# Patient Record
Sex: Female | Born: 1995 | Race: Black or African American | Hispanic: No | Marital: Single | Smoking: Current every day smoker
Health system: Southern US, Community
[De-identification: ages and names within clinical notes are randomized; demographics above are authoritative.]

## PROBLEM LIST (undated history)

## (undated) DIAGNOSIS — N730 Acute parametritis and pelvic cellulitis: Secondary | ICD-10-CM

---
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
## 2013-11-29 ENCOUNTER — Emergency Department (HOSPITAL_BASED_OUTPATIENT_CLINIC_OR_DEPARTMENT_OTHER)
Admission: EM | Admit: 2013-11-29 | Discharge: 2013-11-29 | Disposition: A | Discharge status: Home / Self Care | Payer: Medicaid Other | Attending: Emergency Medicine | Admitting: Emergency Medicine

## 2013-11-29 ENCOUNTER — Encounter (HOSPITAL_BASED_OUTPATIENT_CLINIC_OR_DEPARTMENT_OTHER): Payer: Self-pay | Admitting: Emergency Medicine

## 2013-11-29 ENCOUNTER — Emergency Department (HOSPITAL_BASED_OUTPATIENT_CLINIC_OR_DEPARTMENT_OTHER): Payer: Medicaid Other

## 2013-11-29 DIAGNOSIS — S60229A Contusion of unspecified hand, initial encounter: Secondary | ICD-10-CM | POA: Diagnosis not present

## 2013-11-29 DIAGNOSIS — Y9389 Activity, other specified: Secondary | ICD-10-CM | POA: Diagnosis not present

## 2013-11-29 DIAGNOSIS — S6990XA Unspecified injury of unspecified wrist, hand and finger(s), initial encounter: Secondary | ICD-10-CM | POA: Diagnosis present

## 2013-11-29 DIAGNOSIS — Z79899 Other long term (current) drug therapy: Secondary | ICD-10-CM | POA: Insufficient documentation

## 2013-11-29 DIAGNOSIS — Y9289 Other specified places as the place of occurrence of the external cause: Secondary | ICD-10-CM | POA: Insufficient documentation

## 2013-11-29 DIAGNOSIS — F17200 Nicotine dependence, unspecified, uncomplicated: Secondary | ICD-10-CM | POA: Diagnosis not present

## 2013-11-29 DIAGNOSIS — IMO0002 Reserved for concepts with insufficient information to code with codable children: Secondary | ICD-10-CM | POA: Diagnosis not present

## 2013-11-29 DIAGNOSIS — S60221A Contusion of right hand, initial encounter: Secondary | ICD-10-CM

## 2013-11-29 MED ORDER — IBUPROFEN 800 MG PO TABS
800.0000 mg | ORAL_TABLET | Freq: Three times a day (TID) | ORAL | Status: DC
Start: 2013-11-29 — End: 2015-09-18

## 2013-11-29 NOTE — ED Provider Notes (Signed)
Medical screening examination/treatment/procedure(s) were performed by non-physician practitioner and as supervising physician I was immediately available for consultation/collaboration.    Marcayla Budge, MD 11/29/13 2356 
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
## 2013-11-29 NOTE — Discharge Instructions (Signed)
Contusion °A contusion is a deep bruise. Contusions are the result of an injury that caused bleeding under the skin. The contusion may turn blue, purple, or yellow. Minor injuries will give you a painless contusion, but more severe contusions may stay painful and swollen for a few weeks.  °CAUSES  °A contusion is usually caused by a blow, trauma, or direct force to an area of the body. °SYMPTOMS  °· Swelling and redness of the injured area. °· Bruising of the injured area. °· Tenderness and soreness of the injured area. °· Pain. °DIAGNOSIS  °The diagnosis can be made by taking a history and physical exam. An X-ray, CT scan, or MRI may be needed to determine if there were any associated injuries, such as fractures. °TREATMENT  °Specific treatment will depend on what area of the body was injured. In general, the best treatment for a contusion is resting, icing, elevating, and applying cold compresses to the injured area. Over-the-counter medicines may also be recommended for pain control. Ask your caregiver what the best treatment is for your contusion. °HOME CARE INSTRUCTIONS  °· Put ice on the injured area. °¨ Put ice in a plastic bag. °¨ Place a towel between your skin and the bag. °¨ Leave the ice on for 15-20 minutes, 3-4 times a day, or as directed by your health care provider. °· Only take over-the-counter or prescription medicines for pain, discomfort, or fever as directed by your caregiver. Your caregiver may recommend avoiding anti-inflammatory medicines (aspirin, ibuprofen, and naproxen) for 48 hours because these medicines may increase bruising. °· Rest the injured area. °· If possible, elevate the injured area to reduce swelling. °SEEK IMMEDIATE MEDICAL CARE IF:  °· You have increased bruising or swelling. °· You have pain that is getting worse. °· Your swelling or pain is not relieved with medicines. °MAKE SURE YOU:  °· Understand these instructions. °· Will watch your condition. °· Will get help right  away if you are not doing well or get worse. °Document Released: 12/09/2004 Document Revised: 03/06/2013 Document Reviewed: 01/04/2011 °ExitCare® Patient Information ©2015 [employer]. This information is not intended to replace advice given to you by your health care provider. Make sure you discuss any questions you have with your health care provider. ° °
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
## 2014-01-14 ENCOUNTER — Encounter (HOSPITAL_BASED_OUTPATIENT_CLINIC_OR_DEPARTMENT_OTHER): Payer: Self-pay | Admitting: Emergency Medicine

## 2014-09-09 ENCOUNTER — Emergency Department (HOSPITAL_BASED_OUTPATIENT_CLINIC_OR_DEPARTMENT_OTHER)
Admission: EM | Admit: 2014-09-09 | Discharge: 2014-09-09 | Disposition: A | Discharge status: Home / Self Care | Payer: Medicaid Other | Attending: Emergency Medicine | Admitting: Emergency Medicine

## 2014-09-09 ENCOUNTER — Encounter (HOSPITAL_BASED_OUTPATIENT_CLINIC_OR_DEPARTMENT_OTHER): Payer: Self-pay | Admitting: *Deleted

## 2014-09-09 DIAGNOSIS — S4992XA Unspecified injury of left shoulder and upper arm, initial encounter: Secondary | ICD-10-CM | POA: Diagnosis present

## 2014-09-09 DIAGNOSIS — Z791 Long term (current) use of non-steroidal anti-inflammatories (NSAID): Secondary | ICD-10-CM | POA: Insufficient documentation

## 2014-09-09 DIAGNOSIS — Z720 Tobacco use: Secondary | ICD-10-CM | POA: Insufficient documentation

## 2014-09-09 DIAGNOSIS — S199XXA Unspecified injury of neck, initial encounter: Secondary | ICD-10-CM | POA: Diagnosis not present

## 2014-09-09 DIAGNOSIS — Y9389 Activity, other specified: Secondary | ICD-10-CM | POA: Insufficient documentation

## 2014-09-09 DIAGNOSIS — Y9289 Other specified places as the place of occurrence of the external cause: Secondary | ICD-10-CM | POA: Insufficient documentation

## 2014-09-09 DIAGNOSIS — M25512 Pain in left shoulder: Secondary | ICD-10-CM

## 2014-09-09 DIAGNOSIS — Y990 Civilian activity done for income or pay: Secondary | ICD-10-CM | POA: Insufficient documentation

## 2014-09-09 DIAGNOSIS — X58XXXA Exposure to other specified factors, initial encounter: Secondary | ICD-10-CM | POA: Insufficient documentation

## 2014-09-09 MED ORDER — METHOCARBAMOL 500 MG PO TABS
500.0000 mg | ORAL_TABLET | Freq: Once | ORAL | Status: AC
  Administered 2014-09-09: 500 mg via ORAL
  Filled 2014-09-09: qty 1

## 2014-09-09 MED ORDER — METHOCARBAMOL 500 MG PO TABS: 500.0000 mg | ORAL_TABLET | Freq: Two times a day (BID) | ORAL | Status: DC | PRN

## 2014-09-09 MED ORDER — NAPROXEN 250 MG PO TABS: 250.0000 mg | ORAL_TABLET | Freq: Two times a day (BID) | ORAL | Status: DC

## 2014-09-09 MED ORDER — KETOROLAC TROMETHAMINE 30 MG/ML IJ SOLN
INTRAMUSCULAR | Status: AC
  Administered 2014-09-09: 30 mg
  Filled 2014-09-09: qty 1

## 2014-09-09 MED ORDER — KETOROLAC TROMETHAMINE 60 MG/2ML IM SOLN
30.0000 mg | Freq: Once | INTRAMUSCULAR | Status: DC
  Filled 2014-09-09: qty 2

## 2014-09-09 NOTE — Discharge Instructions (Signed)
Shoulder Pain The shoulder is the joint that connects your arms to your body. The bones that form the shoulder joint include the upper arm bone (humerus), the shoulder blade (scapula), and the collarbone (clavicle). The top of the humerus is shaped like a ball and fits into a rather flat socket on the scapula (glenoid cavity). A combination of muscles and strong, fibrous tissues that connect muscles to bones (tendons) support your shoulder joint and hold the ball in the socket. Small, fluid-filled sacs (bursae) are located in different areas of the joint. They act as cushions between the bones and the overlying soft tissues and help reduce friction between the gliding tendons and the bone as you move your arm. Your shoulder joint allows a wide range of motion in your arm. This range of motion allows you to do things like scratch your back or throw a ball. However, this range of motion also makes your shoulder more prone to pain from overuse and injury. Causes of shoulder pain can originate from both injury and overuse and usually can be grouped in the following four categories:  Redness, swelling, and pain (inflammation) of the tendon (tendinitis) or the bursae (bursitis).  Instability, such as a dislocation of the joint.  Inflammation of the joint (arthritis).  Broken bone (fracture). HOME CARE INSTRUCTIONS   Apply ice to the sore area.  Put ice in a plastic bag.  Place a towel between your skin and the bag.  Leave the ice on for 15-20 minutes, 3-4 times per day for the first 2 days, or as directed by your health care provider.  Stop using cold packs if they do not help with the pain.  If you have a shoulder sling or immobilizer, wear it as long as your caregiver instructs. Only remove it to shower or bathe. Move your arm as little as possible, but keep your hand moving to prevent swelling.  Squeeze a soft ball or foam pad as much as possible to help prevent swelling.  Only take  over-the-counter or prescription medicines for pain, discomfort, or fever as directed by your caregiver. SEEK MEDICAL CARE IF:   Your shoulder pain increases, or new pain develops in your arm, hand, or fingers.  Your hand or fingers become cold and numb.  Your pain is not relieved with medicines. SEEK IMMEDIATE MEDICAL CARE IF:   Your arm, hand, or fingers are numb or tingling.  Your arm, hand, or fingers are significantly swollen or turn white or blue. MAKE SURE YOU:   Understand these instructions.  Will watch your condition.  Will get help right away if you are not doing well or get worse. Document Released: 12/09/2004 Document Revised: 07/16/2013 Document Reviewed: 02/13/2011 [facility] Patient Information 2015 [place], [place]. This information is not intended to replace advice given to you by your health care provider. Make sure you discuss any questions you have with your health care provider. Musculoskeletal Pain Musculoskeletal pain is muscle and boney aches and pains. These pains can occur in any part of the body. Your caregiver may treat you without knowing the cause of the pain. They may treat you if blood or urine tests, X-rays, and other tests were normal.  CAUSES There is often not a definite cause or reason for these pains. These pains may be caused by a type of germ (virus). The discomfort may also come from overuse. Overuse includes working out too hard when your body is not fit. Boney aches also come from weather changes. Bone  is sensitive to atmospheric pressure changes. HOME CARE INSTRUCTIONS   Ask when your test results will be ready. Make sure you get your test results.  Only take over-the-counter or prescription medicines for pain, discomfort, or fever as directed by your caregiver. If you were given medications for your condition, do not drive, operate machinery or power tools, or sign legal documents for 24 hours. Do not drink alcohol. Do not take sleeping pills  or other medications that may interfere with treatment.  Continue all activities unless the activities cause more pain. When the pain lessens, slowly resume normal activities. Gradually increase the intensity and duration of the activities or exercise.  During periods of severe pain, bed rest may be helpful. Lay or sit in any position that is comfortable.  Putting ice on the injured area.  Put ice in a bag.  Place a towel between your skin and the bag.  Leave the ice on for 15 to 20 minutes, 3 to 4 times a day.  Follow up with your caregiver for continued problems and no reason can be found for the pain. If the pain becomes worse or does not go away, it may be necessary to repeat tests or do additional testing. Your caregiver may need to look further for a possible cause. SEEK IMMEDIATE MEDICAL CARE IF:  You have pain that is getting worse and is not relieved by medications.  You develop chest pain that is associated with shortness or breath, sweating, feeling sick to your stomach (nauseous), or throw up (vomit).  Your pain becomes localized to the abdomen.  You develop any new symptoms that seem different or that concern you. MAKE SURE YOU:   Understand these instructions.  Will watch your condition.  Will get help right away if you are not doing well or get worse. Document Released: 03/01/2005 Document Revised: 05/24/2011 Document Reviewed: 11/03/2012 [facility] Patient Information 2015 [place]. This information is not intended to replace advice given to you by your health care provider. Make sure you discuss any questions you have with your health care provider.

## 2014-09-09 NOTE — ED Provider Notes (Signed)
CSN: [id number]     Arrival date & time 09/09/14  1510 History   First MD Initiated Contact with Patient 09/09/14 1520     Chief Complaint  Patient presents with  . Shoulder Pain   Amy Kennedy is a 19 y.o. female who is otherwise healthy who presents to the ED complaining of left shoulder pain since yesterday after doing lots of heavy lifting at work. The patient complains of 10 out of 10 left shoulder pain that radiates into her neck that she describes an ache. The patient has taken nothing for treatment today. 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 Yesterday she was lifting lots of very heavy boxes. She reports her pain is worsened since lifting these heavy boxes. She reports her pain is worse with movement of her arm. Patient is on Depo for birth control. The patient denies fevers, chills, numbness, tingling, weakness, chest pain, cough, shortness of breath, or previous shoulder injury.  (Consider location/radiation/quality/duration/timing/severity/associated sxs/prior Treatment) HPI  History reviewed. No pertinent past medical history. History reviewed. No pertinent past surgical history. History reviewed. No pertinent family history. History  Substance Use Topics  . Smoking status: Current Every Day Smoker -- 0.50 packs/day    Types: Cigarettes  . Smokeless tobacco: Not on file  . Alcohol Use: No   OB History    Gravida Para Term Preterm AB TAB SAB Ectopic Multiple Living   1              Review of Systems  Constitutional: Negative for fever.  Eyes: Negative for visual disturbance.  Respiratory: Negative for cough and shortness of breath.   Cardiovascular: Negative for chest pain.  Gastrointestinal: Negative for abdominal pain.  Musculoskeletal: Positive for arthralgias (left shoulder pain. ) and neck pain. Negative for back pain, joint swelling and neck stiffness.  Skin: Negative for rash.  Neurological: Negative for weakness,  light-headedness, numbness and headaches.      Allergies  Review of patient's allergies indicates no known allergies.  Home Medications   Prior to Admission medications   Medication Sig Start Date End Date Taking? Authorizing Provider  ibuprofen (ADVIL,MOTRIN) 800 MG tablet Take 1 tablet (800 mg total) by mouth 3 (three) times daily. 11/29/13   Elson AreasLeslie K Sofia, PA-C  methocarbamol (ROBAXIN) 500 MG tablet Take 1 tablet (500 mg total) by mouth 2 (two) times daily as needed for muscle spasms. 09/09/14   Everlene FarrierWilliam Hooria Gasparini, PA-C  naproxen (NAPROSYN) 250 MG tablet Take 1 tablet (250 mg total) by mouth 2 (two) times daily with a meal. 09/09/14   Everlene FarrierWilliam Brystal Kildow, PA-C   BP 129/78 mmHg  Pulse 60  Temp(Src) 98.6 F (37 C)  Resp 18  Ht 5\' 1"  (1.549 m)  Wt 165 lb (74.844 kg)  BMI 31.19 kg/m2  SpO2 98%  Breastfeeding? Unknown Physical Exam  Constitutional: She appears well-developed and well-nourished. No distress.  HENT:  Head: Normocephalic and atraumatic.  Eyes: Right eye exhibits no discharge. Left eye exhibits no discharge.  Neck: Normal range of motion. Neck supple. No JVD present. No tracheal deviation present.    Cardiovascular: Normal rate, regular rhythm, normal heart sounds and intact distal pulses.   Bilateral radial pulses are intact.  Pulmonary/Chest: Effort normal and breath sounds normal. No respiratory distress. She has no wheezes. She has no rales.  Lungs clear to auscultation bilaterally.  Musculoskeletal: Normal range of motion. She exhibits tenderness. She exhibits no edema.  There is tenderness over the  patient's left trapezius muscle that extends into her neck and into her left shoulder. Patient has no bony point tenderness. No midline back or neck tenderness. Full range of motion of her neck. No left elbow or wrist tenderness. Full left shoulder range of motion. No left shoulder, back or neck deformity, edema, erythema or ecchymosis noted.  Lymphadenopathy:    She has no  cervical adenopathy.  Neurological: She is alert. Coordination normal.  Skin: No rash noted. She is not diaphoretic.  Psychiatric: She has a normal mood and affect. Her behavior is normal.  Nursing note and vitals reviewed.   ED Course  Procedures (including critical care time) Labs Review Labs Reviewed - No data to display  Imaging Review No results found.   EKG Interpretation None      Filed Vitals:   09/09/14 1514  BP: 129/78  Pulse: 60  Temp: 98.6 F (37 C)  Resp: 18  Height:  (1.549 m)  Weight: 165 lb (74.844 kg)  SpO2: 98%     MDM   Meds given in ED:  Medications  ketorolac (TORADOL) injection 30 mg (not administered)  methocarbamol (ROBAXIN) tablet 500 mg (500 mg Oral Given 09/09/14 1550)  ketorolac (TORADOL) 30 MG/ML injection (30 mg  Given 09/09/14 1551)    New Prescriptions   METHOCARBAMOL (ROBAXIN) 500 MG TABLET    Take 1 tablet (500 mg total) by mouth 2 (two) times daily as needed for muscle spasms.   NAPROXEN (NAPROSYN) 250 MG TABLET    Take 1 tablet (250 mg total) by mouth 2 (two) times daily with a meal.    Final diagnoses:  Left shoulder pain   This  is a 19 y.o. female who is otherwise healthy who presents to the ED complaining of left shoulder pain since yesterday after doing lots of heavy lifting at work. The patient complains of 10 out of 10 left shoulder pain that radiates into her neck that she describes an ache. The patient has taken nothing for treatment today. 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 Yesterday she was lifting lots of very heavy boxes. On exam the patient is afebrile and nontoxic appearing. Patient has tenderness over her left trapezius muscle. There is no bony point tenderness noted to her left shoulder. No midline neck or back tenderness. She has full range of motion of her neck. The patient has musculoskeletal left shoulder and neck pain. Patient given Toradol and Robaxin in the  emergency department. Patient discharged with prescription for naproxen and Robaxin. I advised patient use caution while taking Robaxin as it can make her drowsy. I advised the patient to follow-up with their primary care provider this week. I advised the patient to return to the emergency department with new or worsening symptoms or new concerns. The patient verbalized understanding and agreement with plan.     Everlene Farrier, PA-C 09/09/14 1557  Zadie Rhine, MD 09/09/14 [phone]

## 2014-09-09 NOTE — ED Notes (Signed)
Pt c/o left shoulder and neck pain x 1 day

## 2015-07-16 ENCOUNTER — Emergency Department (HOSPITAL_BASED_OUTPATIENT_CLINIC_OR_DEPARTMENT_OTHER)
Admission: EM | Admit: 2015-07-16 | Discharge: 2015-07-17 | Disposition: A | Discharge status: Home / Self Care | Payer: Self-pay | Attending: Emergency Medicine | Admitting: Emergency Medicine

## 2015-07-16 ENCOUNTER — Emergency Department (HOSPITAL_BASED_OUTPATIENT_CLINIC_OR_DEPARTMENT_OTHER): Payer: Self-pay

## 2015-07-16 ENCOUNTER — Encounter (HOSPITAL_BASED_OUTPATIENT_CLINIC_OR_DEPARTMENT_OTHER): Payer: Self-pay | Admitting: *Deleted

## 2015-07-16 DIAGNOSIS — F17210 Nicotine dependence, cigarettes, uncomplicated: Secondary | ICD-10-CM | POA: Insufficient documentation

## 2015-07-16 DIAGNOSIS — S022XXA Fracture of nasal bones, initial encounter for closed fracture: Secondary | ICD-10-CM

## 2015-07-16 DIAGNOSIS — H1131 Conjunctival hemorrhage, right eye: Secondary | ICD-10-CM

## 2015-07-16 DIAGNOSIS — Z23 Encounter for immunization: Secondary | ICD-10-CM | POA: Insufficient documentation

## 2015-07-16 DIAGNOSIS — H05231 Hemorrhage of right orbit: Secondary | ICD-10-CM

## 2015-07-16 DIAGNOSIS — S4992XA Unspecified injury of left shoulder and upper arm, initial encounter: Secondary | ICD-10-CM

## 2015-07-16 DIAGNOSIS — Y999 Unspecified external cause status: Secondary | ICD-10-CM | POA: Insufficient documentation

## 2015-07-16 DIAGNOSIS — S0511XA Contusion of eyeball and orbital tissues, right eye, initial encounter: Secondary | ICD-10-CM | POA: Insufficient documentation

## 2015-07-16 DIAGNOSIS — Y929 Unspecified place or not applicable: Secondary | ICD-10-CM | POA: Insufficient documentation

## 2015-07-16 DIAGNOSIS — Y939 Activity, unspecified: Secondary | ICD-10-CM | POA: Insufficient documentation

## 2015-07-16 MED ORDER — TETANUS-DIPHTH-ACELL PERTUSSIS 5-2.5-18.5 LF-MCG/0.5 IM SUSP
0.5000 mL | Freq: Once | INTRAMUSCULAR | Status: AC
  Administered 2015-07-16: 0.5 mL via INTRAMUSCULAR
  Filled 2015-07-16: qty 0.5
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
## 2015-07-17 MED ORDER — HYDROCODONE-ACETAMINOPHEN 5-325 MG PO TABS: 1.0000 | ORAL_TABLET | Freq: Four times a day (QID) | ORAL | Status: DC | PRN

## 2015-07-30 ENCOUNTER — Encounter (HOSPITAL_BASED_OUTPATIENT_CLINIC_OR_DEPARTMENT_OTHER): Payer: Self-pay | Admitting: *Deleted

## 2015-07-30 ENCOUNTER — Emergency Department (HOSPITAL_BASED_OUTPATIENT_CLINIC_OR_DEPARTMENT_OTHER)
Admission: EM | Admit: 2015-07-30 | Discharge: 2015-07-30 | Disposition: A | Discharge status: Home / Self Care | Payer: Medicaid Other | Attending: Emergency Medicine | Admitting: Emergency Medicine

## 2015-07-30 DIAGNOSIS — N926 Irregular menstruation, unspecified: Secondary | ICD-10-CM | POA: Insufficient documentation

## 2015-07-30 DIAGNOSIS — F17210 Nicotine dependence, cigarettes, uncomplicated: Secondary | ICD-10-CM | POA: Insufficient documentation

## 2015-07-30 LAB — WET PREP, GENITAL
Clue Cells Wet Prep HPF POC: NONE SEEN
SPERM: NONE SEEN
Trich, Wet Prep: NONE SEEN

## 2015-07-30 LAB — URINALYSIS, ROUTINE W REFLEX MICROSCOPIC
BILIRUBIN URINE: NEGATIVE
Glucose, UA: NEGATIVE mg/dL
KETONES UR: NEGATIVE mg/dL
LEUKOCYTES UA: NEGATIVE
NITRITE: NEGATIVE
PROTEIN: NEGATIVE mg/dL
Specific Gravity, Urine: 1.024 (ref 1.005–1.030)
pH: 6.5 (ref 5.0–8.0)

## 2015-07-30 LAB — URINE MICROSCOPIC-ADD ON: WBC UA: NONE SEEN WBC/hpf (ref 0–5)

## 2015-07-30 LAB — PREGNANCY, URINE: Preg Test, Ur: NEGATIVE

## 2015-07-30 NOTE — ED Notes (Signed)
Pt c/o vaginal bleeding with clots x 10 hrs

## 2015-07-30 NOTE — ED Notes (Signed)
See downtime charting. 
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
## 2015-07-31 LAB — GC/CHLAMYDIA PROBE AMP (~~LOC~~) NOT AT ARMC
CHLAMYDIA, DNA PROBE: NEGATIVE
NEISSERIA GONORRHEA: NEGATIVE

## 2015-09-18 ENCOUNTER — Encounter (HOSPITAL_BASED_OUTPATIENT_CLINIC_OR_DEPARTMENT_OTHER): Payer: Self-pay | Admitting: *Deleted

## 2015-09-18 ENCOUNTER — Emergency Department (HOSPITAL_BASED_OUTPATIENT_CLINIC_OR_DEPARTMENT_OTHER): Payer: Self-pay

## 2015-09-18 ENCOUNTER — Emergency Department (HOSPITAL_BASED_OUTPATIENT_CLINIC_OR_DEPARTMENT_OTHER)
Admission: EM | Admit: 2015-09-18 | Discharge: 2015-09-18 | Disposition: A | Discharge status: Home / Self Care | Payer: Self-pay | Attending: Emergency Medicine | Admitting: Emergency Medicine

## 2015-09-18 DIAGNOSIS — J189 Pneumonia, unspecified organism: Secondary | ICD-10-CM | POA: Insufficient documentation

## 2015-09-18 DIAGNOSIS — F17210 Nicotine dependence, cigarettes, uncomplicated: Secondary | ICD-10-CM | POA: Insufficient documentation

## 2015-09-18 LAB — RAPID STREP SCREEN (MED CTR MEBANE ONLY): Streptococcus, Group A Screen (Direct): NEGATIVE

## 2015-09-18 MED ORDER — AZITHROMYCIN 250 MG PO TABS: 250.0000 mg | ORAL_TABLET | Freq: Every day | ORAL | Status: DC

## 2015-09-18 MED ORDER — IBUPROFEN 800 MG PO TABS
800.0000 mg | ORAL_TABLET | Freq: Once | ORAL | Status: AC
  Administered 2015-09-18: 800 mg via ORAL
  Filled 2015-09-18: qty 1

## 2015-09-18 MED ORDER — BENZONATATE 100 MG PO CAPS: 100.0000 mg | ORAL_CAPSULE | Freq: Three times a day (TID) | ORAL | Status: DC

## 2015-09-18 NOTE — ED Notes (Signed)
PA at bedside.

## 2015-09-18 NOTE — Discharge Instructions (Signed)
Take the prescribed medication as directed.  Continue tylenol or motrin as needed for fever. Follow-up with your primary care doctor. Return to the ED for new or worsening symptoms.

## 2015-09-18 NOTE — ED Notes (Signed)
Pt states sore throat x 2 days , daughter at home with strep

## 2015-09-18 NOTE — ED Provider Notes (Signed)
CSN: [id number]     Arrival date & time 09/18/15  1854 History  By signing my name below, I, Amy Kennedy, attest that this documentation has been prepared under the direction and in the presence of Garlon HatchetLisa M Marlise Fahr, PA-C. Electronically Signed: Placido SouLogan Kennedy, ED Scribe. 09/18/2015. 7:26 PM.   Chief Complaint  Patient presents with  . Sore Throat   The history is provided by the patient. No language interpreter was used.    HPI Comments: [name] is a 20 y.o. female who presents to the Emergency Department complaining of constant, mild, itchy, sore throat x 3 days. Pt states she has a young daughter at home who was dx with strep. She reports an associated, mild fever onset earlier today (101.9 F in triage), dry cough and chills. She denies having taken anything for pain management. Pt denies any other associated symptoms at this time.   History reviewed. No pertinent past medical history. History reviewed. No pertinent past surgical history. History reviewed. No pertinent family history. Social History  Substance Use Topics  . Smoking status: Current Every Day Smoker -- 0.50 packs/day    Types: Cigars  . Smokeless tobacco: None  . Alcohol Use: No   OB History    Gravida Para Term Preterm AB TAB SAB Ectopic Multiple Living   1              Review of Systems  Constitutional: Positive for fever and chills.  HENT: Positive for sore throat.   Respiratory: Positive for cough.   All other systems reviewed and are negative.  Allergies  Review of patient's allergies indicates no known allergies.  Home Medications   Prior to Admission medications   Not on File   BP 109/70 mmHg  Pulse 95  Temp(Src) 101.9 F (38.8 C) (Oral)  Resp 18  Ht 5\' 3"  (1.6 m)  Wt 133 lb (60.328 kg)  BMI 23.57 kg/m2  SpO2 97%  LMP 09/15/2015    Physical Exam  Constitutional: She is oriented to person, place, and time. She appears well-developed and well-nourished. No distress.  HENT:  Head:  Normocephalic and atraumatic.  Right Ear: Tympanic membrane and ear canal normal.  Left Ear: Tympanic membrane and ear canal normal.  Nose: Nose normal.  Mouth/Throat: Uvula is midline, oropharynx is clear and moist and mucous membranes are normal.  Tonsils overall normal in appearance bilaterally without exudate; uvula midline without evidence of peritonsillar abscess; handling secretions appropriately; no difficulty swallowing or speaking; normal phonation without stridor  Eyes: EOM are normal.  Neck: Normal range of motion. Neck supple.  Cardiovascular: Normal rate.   Pulmonary/Chest: Effort normal and breath sounds normal. No respiratory distress. She has no wheezes. She has no rales.  Abdominal: Soft.  Musculoskeletal: Normal range of motion.  Neurological: She is alert and oriented to person, place, and time.  Skin: Skin is warm and dry. She is not diaphoretic.  Psychiatric: She has a normal mood and affect.  Nursing note and vitals reviewed.  ED Course  Procedures  DIAGNOSTIC STUDIES: Oxygen Saturation is 97% on RA, normal by my interpretation.    COORDINATION OF CARE: 7:23 PM Discussed next steps with pt. Pt verbalized understanding and is agreeable with the plan.   Labs Review Labs Reviewed  RAPID STREP [facility]CREEN (NOT [facility])  CULTURE, GROUP A STREP [facility])   Imaging Review Dg Chest 2 View  09/18/2015  CLINICAL DATA:  Cough, fever, left-sided chest pain. Symptom onset today. EXAM: CHEST  2  VIEW COMPARISON:  None. FINDINGS: Lingular consolidation consistent with pneumonia. Possible patchy opacity in the left lower lobe as well. Right lung is clear. Heart size and mediastinal contours are normal. No pleural effusion, pneumothorax or pulmonary edema. Mild broad-based rightward curvature of the thoracic spine. IMPRESSION: Lingular and possible left lower lobe pneumonia. Electronically Signed   By: Rubye OaksMelanie  Ehinger M.D.   On: 09/18/2015 20:00   I have personally reviewed and  evaluated these lab results as part of my medical decision-making.   EKG Interpretation None      MDM   Final diagnoses:  CAP (community acquired pneumonia)   20 y.o. F here with fever, scratchy throat, and cough.  She is febrile but non-toxic in appearance.  Tonsils normal in appearance on exam, no signs of peritonsillar abscess.  Handling secretions well.  Lungs overall clear.  Rapid strep negative.  CXR is lingular +/- LLL pneumonia.  VS remain stable on RA.  Will start on azithromycin, tessalon.  Follow-up with PCP.  Discussed plan with patient, he/she acknowledged understanding and agreed with plan of care.  Return precautions given for new or worsening symptoms.  I personally performed the services described in this documentation, which was scribed in my presence. The recorded information has been reviewed and is accurate.  Garlon HatchetLisa M Merwyn Hodapp, PA-C 09/18/15 2042  Vanetta MuldersScott Zackowski, MD 09/20/15 [phone]

## 2015-09-21 LAB — CULTURE, GROUP A STREP (THRC)

## 2016-04-11 ENCOUNTER — Encounter (HOSPITAL_BASED_OUTPATIENT_CLINIC_OR_DEPARTMENT_OTHER): Payer: Self-pay | Admitting: Emergency Medicine

## 2016-04-11 ENCOUNTER — Emergency Department (HOSPITAL_BASED_OUTPATIENT_CLINIC_OR_DEPARTMENT_OTHER)
Admission: EM | Admit: 2016-04-11 | Discharge: 2016-04-12 | Disposition: A | Discharge status: Home / Self Care | Payer: Medicaid Other | Attending: Emergency Medicine | Admitting: Emergency Medicine

## 2016-04-11 DIAGNOSIS — F17290 Nicotine dependence, other tobacco product, uncomplicated: Secondary | ICD-10-CM | POA: Insufficient documentation

## 2016-04-11 DIAGNOSIS — A084 Viral intestinal infection, unspecified: Secondary | ICD-10-CM

## 2016-04-11 HISTORY — DX: Acute parametritis and pelvic cellulitis: N73.0

## 2016-04-11 LAB — URINALYSIS, ROUTINE W REFLEX MICROSCOPIC
GLUCOSE, UA: NEGATIVE mg/dL
Ketones, ur: >80 mg/dL — AB
Nitrite: NEGATIVE
PH: 6 (ref 5.0–8.0)
PROTEIN: 100 mg/dL — AB
Specific Gravity, Urine: 1.03 (ref 1.005–1.030)

## 2016-04-11 LAB — PREGNANCY, URINE: PREG TEST UR: NEGATIVE

## 2016-04-11 LAB — URINALYSIS, MICROSCOPIC (REFLEX)

## 2016-04-11 MED ORDER — ONDANSETRON 4 MG PO TBDP
4.0000 mg | ORAL_TABLET | Freq: Once | ORAL | Status: AC
  Administered 2016-04-11: 4 mg via ORAL
  Filled 2016-04-11: qty 1

## 2016-04-11 NOTE — ED Provider Notes (Signed)
[facility] Provider Note   CSN: [id number] Arrival date & time: 04/11/16  1914   By signing my name below, I, Amy Kennedy, attest that this documentation has been prepared under the direction and in the presence of [employer], PA-C. Electronically Signed: Clarisse Kennedy, Scribe. 04/11/16. 9:59 PM.   History   Chief Complaint Chief Complaint  Patient presents with  . Nausea   HPI  HPI Comments: Amy Kennedy is a 21 y.o. female who presents to the Emergency Department complaining of episodic vomiting 6 times today. Pt reports associated abdominal pain and decreased urination. She notes baseline vaginal discharge d/t Hx of BV. She states she has tried to eat soup and apple sauce, and she has attempted to drink ginger-ale and kaopectate, but she vomited everything right back up. 1 sick contact noted at home. Pt denies N/V, fever or chills.Marland Kitchen LNMP today.   Past Medical History:  Diagnosis Date  . PID (acute pelvic inflammatory disease)     There are no active problems to display for this patient.   History reviewed. No pertinent surgical history.  OB History    Gravida Para Term Preterm AB Living   1             SAB TAB Ectopic Multiple Live Births                   Home Medications    Prior to Admission medications   Medication Sig Start Date End Date Taking? Authorizing Provider  azithromycin (ZITHROMAX) 250 MG tablet Take 1 tablet (250 mg total) by mouth daily. Take first 2 tablets together, then 1 every day until finished. 09/18/15   Garlon Hatchet, PA-C  benzonatate (TESSALON) 100 MG capsule Take 1 capsule (100 mg total) by mouth every 8 (eight) hours. 09/18/15   Garlon Hatchet, PA-C  ondansetron (ZOFRAN) 4 MG tablet Take 1 tablet (4 mg total) by mouth every 6 (six) hours. 04/12/16   Eyvonne Mechanic, PA-C    Family History History reviewed. No pertinent family history.  Social History Social History  Substance Use Topics  . Smoking status: Current  Every Day Smoker    Packs/day: 0.50    Types: Cigars  . Smokeless tobacco: Never Used  . Alcohol use No     Allergies   Patient has no known allergies.   Review of Systems Review of Systems A complete 10 system review of systems was obtained and all systems are negative except as noted in the HPI and PMH.    Physical Exam Updated Vital Signs BP 122/74 (BP Location: Right Arm)   Pulse 84   Temp 98.7 F (37.1 C) (Oral)   Resp 14   Ht 5\' 6"  (1.676 m)   Wt 135 lb (61.2 kg)   LMP 04/11/2016   SpO2 100%   BMI 21.79 kg/m   Physical Exam  Constitutional: She is oriented to person, place, and time. She appears well-developed and well-nourished.  HENT:  Head: Normocephalic and atraumatic.  Eyes: Conjunctivae are normal. Pupils are equal, round, and reactive to light. Right eye exhibits no discharge. Left eye exhibits no discharge. No scleral icterus.  Neck: Normal range of motion. No JVD present. No tracheal deviation present.  Cardiovascular: Normal rate, regular rhythm and normal heart sounds.   Pulmonary/Chest: Effort normal and breath sounds normal. No stridor.  Abdominal:  Generalized abdominal discomfort worse in the epigastric region.  Neurological: She is alert and oriented to person, place, and time.  Coordination normal.  Psychiatric: She has a normal mood and affect. Her behavior is normal. Judgment and thought content normal.  Nursing note and vitals reviewed.  ED Treatments / Results  DIAGNOSTIC STUDIES: Oxygen Saturation is 100% on RA, normal by my interpretation.    COORDINATION OF CARE: 9:55 PM Discussed treatment plan with pt at bedside and pt agreed to plan. Will order nausea medication and attempt to give pt oral liquids, then reassess.  Labs (all labs ordered are listed, but only abnormal results are displayed) Labs Reviewed  URINALYSIS, ROUTINE W REFLEX MICROSCOPIC - Abnormal; Notable for the following:       Result Value   Color, Urine RED (*)     APPearance CLOUDY (*)    Hgb urine dipstick LARGE (*)    Bilirubin Urine SMALL (*)    Ketones, ur >80 (*)    Protein, ur 100 (*)    Leukocytes, UA SMALL (*)    All other components within normal limits  URINALYSIS, MICROSCOPIC (REFLEX) - Abnormal; Notable for the following:    Bacteria, UA RARE (*)    Squamous Epithelial / LPF 0-5 (*)    All other components within normal limits  PREGNANCY, URINE    EKG  EKG Interpretation None       Radiology No results found.  Procedures Procedures (including critical care time)  Medications Ordered in ED Medications  ondansetron (ZOFRAN-ODT) disintegrating tablet 4 mg (4 mg Oral Given 04/11/16 2203)     Initial Impression / Assessment and Plan / ED Course  I have reviewed the triage vital signs and the nursing notes.  Pertinent labs & imaging results that were available during my care of the patient were reviewed by me and considered in my medical decision making (see chart for details).  Labs: Urinalysis,  urine preg  Imaging:  Consults:  Therapeutics: Zofran  Discharge Meds:   Assessment/Plan:   Well-appearing 21 year old female with vomiting. Patient afebrile nontoxic in no acute distress. Abdominal pain and nonlocalized nonsurgical. Repeat abdominal exam shows no abdominal tenderness, patient tolerating by mouth without difficulty. Patient will be discharged home with symptomatic care instructions, Zofran, primary care follow-up symptoms persist, ED return if they worsen. She verbalized understanding and agreement to today's plan.  Final Clinical Impressions(s) / ED Diagnoses   Final diagnoses:  Viral gastroenteritis    New Prescriptions Discharge Medication List as of 04/12/2016 12:09 AM    START taking these medications   Details  ondansetron (ZOFRAN) 4 MG tablet Take 1 tablet (4 mg total) by mouth every 6 (six) hours., Starting Mon 04/12/2016, Print         Newell RubbermaidJeffrey Donta Mcinroy, PA-C 04/12/16 [id number]    Alvira MondayErin  Schlossman, MD 04/13/16 1730

## 2016-04-11 NOTE — ED Triage Notes (Signed)
Patient states that she has had N/V today. She states that every time she eats she throws up

## 2016-04-11 NOTE — ED Notes (Signed)
Given fluid challenge, tolerating without difficulty, denies c/o's

## 2016-04-11 NOTE — ED Notes (Signed)
States has had 2 periods this month and they have been irregular. No using BC

## 2016-04-12 MED ORDER — ONDANSETRON HCL 4 MG PO TABS: 4.0000 mg | ORAL_TABLET | Freq: Four times a day (QID) | ORAL | 0 refills | Status: DC

## 2016-04-12 NOTE — Discharge Instructions (Signed)
Please read attached information. If you experience any new or worsening signs or symptoms please return to the emergency room for evaluation. Please follow-up with your primary care provider or specialist as discussed. Please use medication prescribed only as directed and discontinue taking if you have any concerning signs or symptoms.   °

## 2016-04-12 NOTE — ED Notes (Signed)
Pt given d/c instructions as per chart. Rx x 1. Verbalizes understanding. No questions. 

## 2019-07-14 ENCOUNTER — Other Ambulatory Visit: Payer: Self-pay

## 2019-07-14 ENCOUNTER — Emergency Department (HOSPITAL_BASED_OUTPATIENT_CLINIC_OR_DEPARTMENT_OTHER)
Admission: EM | Admit: 2019-07-14 | Discharge: 2019-07-14 | Disposition: A | Discharge status: Home / Self Care | Payer: Medicaid Other | Attending: Emergency Medicine | Admitting: Emergency Medicine

## 2019-07-14 ENCOUNTER — Encounter (HOSPITAL_BASED_OUTPATIENT_CLINIC_OR_DEPARTMENT_OTHER): Payer: Self-pay

## 2019-07-14 DIAGNOSIS — S01512A Laceration without foreign body of oral cavity, initial encounter: Secondary | ICD-10-CM | POA: Insufficient documentation

## 2019-07-14 DIAGNOSIS — K120 Recurrent oral aphthae: Secondary | ICD-10-CM | POA: Insufficient documentation

## 2019-07-14 DIAGNOSIS — Y999 Unspecified external cause status: Secondary | ICD-10-CM | POA: Diagnosis not present

## 2019-07-14 DIAGNOSIS — Y9389 Activity, other specified: Secondary | ICD-10-CM | POA: Diagnosis not present

## 2019-07-14 DIAGNOSIS — Y9289 Other specified places as the place of occurrence of the external cause: Secondary | ICD-10-CM | POA: Diagnosis not present

## 2019-07-14 DIAGNOSIS — W228XXA Striking against or struck by other objects, initial encounter: Secondary | ICD-10-CM | POA: Insufficient documentation

## 2019-07-14 DIAGNOSIS — F17210 Nicotine dependence, cigarettes, uncomplicated: Secondary | ICD-10-CM | POA: Diagnosis not present

## 2019-07-14 DIAGNOSIS — R600 Localized edema: Secondary | ICD-10-CM | POA: Diagnosis present

## 2019-07-14 DIAGNOSIS — K122 Cellulitis and abscess of mouth: Secondary | ICD-10-CM

## 2019-07-14 MED ORDER — TRIAMCINOLONE ACETONIDE 0.1 % MT PSTE: 1.0000 "application " | PASTE | Freq: Two times a day (BID) | OROMUCOSAL | 12 refills | Status: DC

## 2019-07-14 MED ORDER — CLINDAMYCIN HCL 300 MG PO CAPS: 300.0000 mg | ORAL_CAPSULE | Freq: Three times a day (TID) | ORAL | 0 refills | Status: AC

## 2019-07-14 NOTE — Discharge Instructions (Signed)
Rinse with Listerine after every meal. Apply triamcinolone paste to canker sore inside lower lip.  Take clindamycin as prescribed and complete the full course.  Take a probiotic as well. Apply warm compresses to face for 20 minutes at a time.
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
## 2019-07-14 NOTE — ED Notes (Signed)
Pt c/o oral pain since Wednesday. Pain is upper gum on right, swelling noted
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
## 2019-08-13 ENCOUNTER — Encounter (HOSPITAL_BASED_OUTPATIENT_CLINIC_OR_DEPARTMENT_OTHER): Payer: Self-pay | Admitting: *Deleted

## 2019-08-13 ENCOUNTER — Other Ambulatory Visit: Payer: Self-pay

## 2019-08-13 ENCOUNTER — Emergency Department (HOSPITAL_BASED_OUTPATIENT_CLINIC_OR_DEPARTMENT_OTHER): Payer: Medicaid Other

## 2019-08-13 ENCOUNTER — Emergency Department (HOSPITAL_BASED_OUTPATIENT_CLINIC_OR_DEPARTMENT_OTHER)
Admission: EM | Admit: 2019-08-13 | Discharge: 2019-08-13 | Disposition: A | Discharge status: Home / Self Care | Payer: Medicaid Other | Attending: Emergency Medicine | Admitting: Emergency Medicine

## 2019-08-13 DIAGNOSIS — Z20822 Contact with and (suspected) exposure to covid-19: Secondary | ICD-10-CM | POA: Diagnosis not present

## 2019-08-13 DIAGNOSIS — R05 Cough: Secondary | ICD-10-CM | POA: Diagnosis not present

## 2019-08-13 DIAGNOSIS — R112 Nausea with vomiting, unspecified: Secondary | ICD-10-CM | POA: Diagnosis not present

## 2019-08-13 DIAGNOSIS — F17290 Nicotine dependence, other tobacco product, uncomplicated: Secondary | ICD-10-CM | POA: Diagnosis not present

## 2019-08-13 DIAGNOSIS — O99330 Smoking (tobacco) complicating pregnancy, unspecified trimester: Secondary | ICD-10-CM | POA: Diagnosis not present

## 2019-08-13 DIAGNOSIS — R058 Other specified cough: Secondary | ICD-10-CM

## 2019-08-13 DIAGNOSIS — O26891 Other specified pregnancy related conditions, first trimester: Secondary | ICD-10-CM

## 2019-08-13 DIAGNOSIS — R109 Unspecified abdominal pain: Secondary | ICD-10-CM | POA: Insufficient documentation

## 2019-08-13 DIAGNOSIS — R6883 Chills (without fever): Secondary | ICD-10-CM

## 2019-08-13 DIAGNOSIS — O26899 Other specified pregnancy related conditions, unspecified trimester: Secondary | ICD-10-CM | POA: Insufficient documentation

## 2019-08-13 DIAGNOSIS — R5383 Other fatigue: Secondary | ICD-10-CM

## 2019-08-13 DIAGNOSIS — R5381 Other malaise: Secondary | ICD-10-CM | POA: Diagnosis not present

## 2019-08-13 DIAGNOSIS — M791 Myalgia, unspecified site: Secondary | ICD-10-CM

## 2019-08-13 LAB — COMPREHENSIVE METABOLIC PANEL
ALT: 13 U/L (ref 0–44)
AST: 20 U/L (ref 15–41)
Albumin: 4.2 g/dL (ref 3.5–5.0)
Alkaline Phosphatase: 51 U/L (ref 38–126)
Anion gap: 11 (ref 5–15)
BUN: 8 mg/dL (ref 6–20)
CO2: 20 mmol/L — ABNORMAL LOW (ref 22–32)
Calcium: 8.7 mg/dL — ABNORMAL LOW (ref 8.9–10.3)
Chloride: 105 mmol/L (ref 98–111)
Creatinine, Ser: 0.68 mg/dL (ref 0.44–1.00)
GFR calc Af Amer: >60 mL/min (ref >60)
GFR calc non Af Amer: >60 mL/min (ref >60)
Glucose, Bld: 77 mg/dL (ref 70–99)
Potassium: 3.5 mmol/L (ref 3.5–5.1)
Sodium: 136 mmol/L (ref 135–145)
Total Bilirubin: 0.6 mg/dL (ref 0.3–1.2)
Total Protein: 7.2 g/dL (ref 6.5–8.1)

## 2019-08-13 LAB — URINALYSIS, ROUTINE W REFLEX MICROSCOPIC
Bilirubin Urine: NEGATIVE
Glucose, UA: NEGATIVE mg/dL
Hgb urine dipstick: NEGATIVE
Ketones, ur: 15 mg/dL — AB
Leukocytes,Ua: NEGATIVE
Nitrite: NEGATIVE
Protein, ur: NEGATIVE mg/dL
Specific Gravity, Urine: >1.03 — ABNORMAL HIGH (ref 1.005–1.030)
pH: 6 (ref 5.0–8.0)

## 2019-08-13 LAB — CBC WITH DIFFERENTIAL/PLATELET
Abs Immature Granulocytes: 0.02 10*3/uL (ref 0.00–0.07)
Basophils Absolute: 0 10*3/uL (ref 0.0–0.1)
Basophils Relative: 0 %
Eosinophils Absolute: 0.1 10*3/uL (ref 0.0–0.5)
Eosinophils Relative: 1 %
HCT: 38.4 % (ref 36.0–46.0)
Hemoglobin: 12.4 g/dL (ref 12.0–15.0)
Immature Granulocytes: 0 %
Lymphocytes Relative: 38 %
Lymphs Abs: 2.6 10*3/uL (ref 0.7–4.0)
MCH: 26.8 pg (ref 26.0–34.0)
MCHC: 32.3 g/dL (ref 30.0–36.0)
MCV: 83.1 fL (ref 80.0–100.0)
Monocytes Absolute: 0.4 10*3/uL (ref 0.1–1.0)
Monocytes Relative: 6 %
Neutro Abs: 3.8 10*3/uL (ref 1.7–7.7)
Neutrophils Relative %: 55 %
Platelets: 282 10*3/uL (ref 150–400)
RBC: 4.62 MIL/uL (ref 3.87–5.11)
RDW: 14.8 % (ref 11.5–15.5)
WBC: 6.9 10*3/uL (ref 4.0–10.5)
nRBC: 0 % (ref 0.0–0.2)

## 2019-08-13 LAB — LIPASE, BLOOD: Lipase: 26 U/L (ref 11–51)

## 2019-08-13 LAB — HCG, QUANTITATIVE, PREGNANCY: hCG, Beta Chain, Quant, S: 3376 m[IU]/mL — ABNORMAL HIGH (ref <5)

## 2019-08-13 LAB — GROUP A STREP BY PCR: Group A Strep by PCR: NOT DETECTED

## 2019-08-13 LAB — SARS CORONAVIRUS 2 BY RT PCR (HOSPITAL ORDER, PERFORMED IN ~~LOC~~ HOSPITAL LAB): SARS Coronavirus 2: NEGATIVE

## 2019-08-13 MED ORDER — SODIUM CHLORIDE 0.9 % IV BOLUS
1000.0000 mL | Freq: Once | INTRAVENOUS | Status: AC
  Administered 2019-08-13: 1000 mL via INTRAVENOUS

## 2019-08-13 MED ORDER — DOXYLAMINE-PYRIDOXINE 10-10 MG PO TBEC: 1.0000 | DELAYED_RELEASE_TABLET | Freq: Every day | ORAL | 0 refills | Status: DC

## 2019-08-13 NOTE — ED Provider Notes (Signed)
Received in sign out from Dr. Rush Landmark.Patient having congestion nv. Pregnancy test + at home.  Plan to await lab work.  Lab work is returned her Covid test is negative and she is pregnant.  She is having no abdominal pain vaginal bleeding or discharge.  We will have her follow-up with GYN in the office.  Started on diclegis for morning sickness.     Melene Plan, DO 08/13/19 1631

## 2019-08-13 NOTE — ED Provider Notes (Signed)
[facility] EMERGENCY DEPARTMENT Provider Note   CSN: [id number] Arrival date & time: 08/13/19  1307     History Chief Complaint  Patient presents with   Covid Symptoms    Amy Kennedy is a 24 y.o. female.  The history is provided by the patient and medical records. No language interpreter was used.  Cough Cough characteristics:  Productive Severity:  Moderate Onset quality:  Gradual Duration:  4 weeks Timing:  Constant Progression:  Worsening Chronicity:  New Relieved by:  Nothing Worsened by:  Nothing Ineffective treatments:  None tried Associated symptoms: chills, myalgias, rhinorrhea and sinus congestion   Associated symptoms: no chest pain, no diaphoresis, no fever, no headaches, no rash, no shortness of breath, no weight loss and no wheezing        Past Medical History:  Diagnosis Date   PID (acute pelvic inflammatory disease)     There are no problems to display for this patient.   History reviewed. No pertinent surgical history.   OB History    Gravida  1   Para      Term      Preterm      AB      Living        SAB      TAB      Ectopic      Multiple      Live Births              No family history on file.  Social History   Tobacco Use   Smoking status: Current Every Day Smoker    Packs/day: 0.50    Types: Cigars   Smokeless tobacco: Never Used  Substance Use Topics   Alcohol use: No   Drug use: No    Home Medications Prior to Admission medications   Medication Sig Start Date End Date Taking? Authorizing Provider  azithromycin (ZITHROMAX) 250 MG tablet Take 1 tablet (250 mg total) by mouth daily. Take first 2 tablets together, then 1 every day until finished. 09/18/15   Larene Pickett, PA-C  benzonatate (TESSALON) 100 MG capsule Take 1 capsule (100 mg total) by mouth every 8 (eight) hours. 09/18/15   Larene Pickett, PA-C  ondansetron (ZOFRAN) 4 MG tablet Take 1 tablet (4 mg total) by mouth every 6 (six)  hours. 04/12/16   Hedges, Dellis Filbert, PA-C  triamcinolone (KENALOG) 0.1 % paste Use as directed 1 application in the mouth or throat 2 (two) times daily. To sore inside lower lip 07/14/19   Tacy Learn, PA-C    Allergies    Patient has no known allergies.  Review of Systems   Review of Systems  Constitutional: Positive for chills and fatigue. Negative for diaphoresis, fever and weight loss.  HENT: Positive for congestion and rhinorrhea.   Eyes: Negative for visual disturbance.  Respiratory: Positive for cough. Negative for chest tightness, shortness of breath and wheezing.   Cardiovascular: Negative for chest pain, palpitations and leg swelling.  Gastrointestinal: Positive for nausea and vomiting. Negative for abdominal distention, abdominal pain, constipation and diarrhea.  Genitourinary: Negative for decreased urine volume, dysuria, flank pain, frequency, pelvic pain, vaginal bleeding, vaginal discharge and vaginal pain.  Musculoskeletal: Positive for myalgias. Negative for back pain, neck pain and neck stiffness.  Skin: Negative for rash.  Neurological: Negative for dizziness, weakness and headaches.  Psychiatric/Behavioral: Negative for agitation and confusion.  All other systems reviewed and are negative.   Physical Exam Updated Vital Signs BP  122/90    Pulse 89    Temp 99 F (37.2 C) (Oral)    Resp 18    Ht 5\' 7"  (1.702 m)    Wt 74.8 kg    LMP 07/14/2019    SpO2 100%    BMI 25.83 kg/m   Physical Exam Vitals and nursing note reviewed.  Constitutional:      General: She is not in acute distress.    Appearance: She is well-developed. She is not ill-appearing, toxic-appearing or diaphoretic.  HENT:     Head: Normocephalic and atraumatic.     Nose: Congestion present. No rhinorrhea.     Mouth/Throat:     Mouth: Mucous membranes are dry.     Pharynx: No oropharyngeal exudate or posterior oropharyngeal erythema.  Eyes:     Extraocular Movements: Extraocular movements intact.      Conjunctiva/sclera: Conjunctivae normal.     Pupils: Pupils are equal, round, and reactive to light.  Cardiovascular:     Rate and Rhythm: Normal rate and regular rhythm.     Pulses: Normal pulses.     Heart sounds: No murmur.  Pulmonary:     Effort: Pulmonary effort is normal. No respiratory distress.     Breath sounds: Normal breath sounds. No wheezing, rhonchi or rales.  Chest:     Chest wall: No tenderness.  Abdominal:     General: Abdomen is flat. There is no distension.     Palpations: Abdomen is soft.     Tenderness: There is no abdominal tenderness. There is no right CVA tenderness, left CVA tenderness, guarding or rebound.  Musculoskeletal:        General: No tenderness.     Cervical back: Neck supple. No tenderness.  Skin:    General: Skin is warm and dry.     Capillary Refill: Capillary refill takes less than 2 seconds.     Findings: No erythema.  Neurological:     General: No focal deficit present.     Mental Status: She is alert.  Psychiatric:        Mood and Affect: Mood normal.     ED Results / Procedures / Treatments   Labs (all labs ordered are listed, but only abnormal results are displayed) Labs Reviewed  GROUP A STREP BY PCR  URINE CULTURE  SARS CORONAVIRUS 2 BY RT PCR (HOSPITAL ORDER, PERFORMED IN [facility])  HCG, QUANTITATIVE, PREGNANCY  CBC WITH DIFFERENTIAL/PLATELET  COMPREHENSIVE METABOLIC PANEL  LIPASE, BLOOD  URINALYSIS, ROUTINE W REFLEX MICROSCOPIC    EKG None  Radiology DG Chest Portable 1 View  Result Date: 08/13/2019 CLINICAL DATA:  Chills and cough EXAM: PORTABLE CHEST 1 VIEW COMPARISON:  December 24, 2016 FINDINGS: The heart size and mediastinal contours are within normal limits. Both lungs are clear. The visualized skeletal structures are unremarkable. IMPRESSION: No active disease. Electronically Signed   By: December 26, 2016 III M.D   On: 08/13/2019 14:27    Procedures Procedures (including critical care  time)  Medications Ordered in ED Medications  sodium chloride 0.9 % bolus 1,000 mL (1,000 mLs Intravenous New Bag/Given 08/13/19 1441)    ED Course  I have reviewed the triage vital signs and the nursing notes.  Pertinent labs & imaging results that were available during my care of the patient were reviewed by me and considered in my medical decision making (see chart for details).    MDM Rules/Calculators/A&P  Amy Kennedy is a 24 y.o. female with no significant past medical history who presents with 1 month of chills and productive cough as well as more recent nausea, vomiting, fatigue, feeling dehydrated, and malaise.  Patient reports he has not been vaccinated for Covid and does work at [employer] and suspect she has been exposed to COVID-19.  She denies any chest pain or shortness of breath but does report a cough that has been ongoing for the last month with occasional sputum production.  She also has had some congestion.  She reports that she has had some nausea and vomiting on and off but denies any constipation or diarrhea.  She denies any urinary symptoms.  She reports that her last normal menstrual cycle was April 7 but she may have had a small one since.  She also says that he took a pregnancy test several days ago that was positive but she does not believe it.  She reports that she has had no headache, neck pain, neck stiffness.  She denies any other past medical problems.  She is very instead getting tested for Covid today.  On exam, lungs are clear and chest and abdomen are nontender.  Normal bowel sounds.  Normal pulses in extremities.  Legs are nontender nonedematous.  Patient resting comfortably but is clearly somewhat anxious.  Oropharyngeal exam is unremarkable.  Ears showed no evidence of otitis media.  No evidence of PTA or RPA.  Clinically I suspect she may have viral infection and we will test for Covid.  We will also get a pregnancy test to confirm  pregnancy.  Given her lack of any abdominal pain or tenderness or vaginal symptoms, do not feel she needs pelvic ultrasound at this time.  No suspicion for ectopic if she is pregnant.  Patient is adamant that she does want a chest x-ray with her productive cough.  We will also get some screening labs and give her some fluids for likely dehydration.  As she is not actively vomiting and it is unclear if she is pregnant or not, will hold on nausea medicine initially.  Given lack of headache or neck pain, low suspicion for meningitis.  Would like abdominal tenderness, doubt intra-abdominal pathology such as gallbladder disease.  Anticipate reassessment after work-up.  Care transferred to Dr. Adela Lank while waiting for results of diagnostic work-up.  Anticipate discharge home after work-up.  Final Clinical Impression(s) / ED Diagnoses Final diagnoses:  Productive cough  Chills  Nausea and vomiting, intractability of vomiting not specified, unspecified vomiting type  Fatigue, unspecified type  Myalgia  Malaise and fatigue   Clinical Impression: 1. Productive cough   2. Chills   3. Nausea and vomiting, intractability of vomiting not specified, unspecified vomiting type   4. Fatigue, unspecified type   5. Myalgia      Disposition: Care transferred to Dr. Adela Lank while waiting for results of diagnostic work-up.  Anticipate discharge home after work-up.  This note was prepared with assistance of Conservation officer, historic buildings. Occasional wrong-word or sound-a-like substitutions may have occurred due to the inherent limitations of voice recognition software.      Arpan Eskelson, Canary Brim, MD 08/13/19 [phone]

## 2019-08-13 NOTE — ED Triage Notes (Signed)
Fever, chills, body aches, headache, cough, fatigue, runny nose and sore throat for a month.

## 2019-08-13 NOTE — Discharge Instructions (Addendum)
Follow up with GYN.   If the prescription is too expensive: Go to a drugstore and buy unisom and vitaminb6(pyridoxine) Take 1/2 a tab of unisom(12.5mg ) and 25mg  of vitamin b6.  Take this at night before bed.  If you continue to have nausea and vomiting take twice a day.  [facility]

## 2019-08-14 LAB — URINE CULTURE: Culture: <10000 — AB

## 2019-10-27 ENCOUNTER — Encounter (HOSPITAL_BASED_OUTPATIENT_CLINIC_OR_DEPARTMENT_OTHER): Payer: Self-pay | Admitting: Emergency Medicine

## 2019-10-27 ENCOUNTER — Other Ambulatory Visit: Payer: Self-pay

## 2019-10-27 ENCOUNTER — Emergency Department (HOSPITAL_BASED_OUTPATIENT_CLINIC_OR_DEPARTMENT_OTHER)
Admission: EM | Admit: 2019-10-27 | Discharge: 2019-10-27 | Disposition: A | Discharge status: Home / Self Care | Payer: Medicaid Other | Attending: Emergency Medicine | Admitting: Emergency Medicine

## 2019-10-27 ENCOUNTER — Emergency Department (HOSPITAL_BASED_OUTPATIENT_CLINIC_OR_DEPARTMENT_OTHER): Payer: Medicaid Other

## 2019-10-27 DIAGNOSIS — F17210 Nicotine dependence, cigarettes, uncomplicated: Secondary | ICD-10-CM | POA: Insufficient documentation

## 2019-10-27 DIAGNOSIS — M25512 Pain in left shoulder: Secondary | ICD-10-CM | POA: Insufficient documentation

## 2019-10-27 NOTE — ED Triage Notes (Signed)
Pt here with left shoulder pain x 2 months

## 2019-10-27 NOTE — Discharge Instructions (Signed)
As we discussed, your x-rays were unremarkable.  Your pain could be due to musculoskeletal, rotator cuff impingement or other irritation/inflammatory pain.  You can take 1000 mg of Tylenol.  Do not exceed 4000 mg of Tylenol a day.  Can apply heat to help with pain.  Follow-up with referred sports management doctor for further evaluation of your symptoms if they continue to get worse.  Return to the emergency department for any worsening pain, redness or swelling of the shoulder, numbness/weakness or any other worsening concerning symptoms.

## 2019-10-27 NOTE — ED Provider Notes (Signed)
[facility] EMERGENCY DEPARTMENT Provider Note   CSN: [id number] Arrival date & time: 10/27/19  0945     History Chief Complaint  Patient presents with  . Shoulder Pain    Amy Kennedy is a 24 y.o. female who presents for evaluation of left shoulder pain that has been ongoing for last 2 months.  She states that the pain has been persistent over the last 2 months.  She states is in her left shoulder and sometimes feels like an ache or sharp pain.  She reports it is worse with movement.  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  She denies any preceding trauma, injury, fall.  She does not take anything for the pain.  Denies any redness or swelling of the shoulder.  Denies any numbness/weakness, fevers.   The history is provided by the patient.       Past Medical History:  Diagnosis Date  . PID (acute pelvic inflammatory disease)     There are no problems to display for this patient.   History reviewed. No pertinent surgical history.   OB History    Gravida  2   Para      Term      Preterm      AB      Living        SAB      TAB      Ectopic      Multiple      Live Births              History reviewed. No pertinent family history.  Social History   Tobacco Use  . Smoking status: Current Every Day Smoker    Packs/day: 0.50    Types: Cigars  . Smokeless tobacco: Never Used  Substance Use Topics  . Alcohol use: No  . Drug use: No    Home Medications Prior to Admission medications   Medication Sig Start Date End Date Taking? Authorizing Provider  azithromycin (ZITHROMAX) 250 MG tablet Take 1 tablet (250 mg total) by mouth daily. Take first 2 tablets together, then 1 every day until finished. 09/18/15   Garlon Hatchet, PA-C  benzonatate (TESSALON) 100 MG capsule Take 1 capsule (100 mg total) by mouth every 8 (eight) hours. 09/18/15   Garlon Hatchet, PA-C  Doxylamine-Pyridoxine 10-10 MG TBEC Take  1 tablet by mouth at bedtime. 08/13/19   Melene Plan, DO  ondansetron (ZOFRAN) 4 MG tablet Take 1 tablet (4 mg total) by mouth every 6 (six) hours. 04/12/16   Hedges, Tinnie Gens, PA-C  triamcinolone (KENALOG) 0.1 % paste Use as directed 1 application in the mouth or throat 2 (two) times daily. To sore inside lower lip 07/14/19   Jeannie Fend, PA-C    Allergies    Patient has no known allergies.  Review of Systems   Review of Systems  Constitutional: Negative for fever.  Musculoskeletal:       Left shoulder pain  Skin: Negative for color change.  Neurological: Negative for weakness and numbness.  All other systems reviewed and are negative.   Physical Exam Updated Vital Signs BP 114/71   Pulse 65   Temp 98 F (36.7 C) (Oral)   Resp 14   LMP  (LMP Unknown)   SpO2 100%   Breastfeeding No   Physical Exam Vitals and nursing note reviewed.  Constitutional:      Appearance: She is well-developed.  HENT:  Head: Normocephalic and atraumatic.  Eyes:     General: No scleral icterus.       Right eye: No discharge.        Left eye: No discharge.     Conjunctiva/sclera: Conjunctivae normal.  Cardiovascular:     Pulses:          Radial pulses are 2+ on the right side and 2+ on the left side.  Pulmonary:     Effort: Pulmonary effort is normal.  Musculoskeletal:     Comments: Tenderness to palpation noted to the posterior and anterior aspect of the left shoulder.  No deformity or crepitus noted.  No overlying warmth, erythema, edema.  Range of motion intact.  She does have some subjective reports of pain with abduction.  Negative Neer's impingement, Hawkins.  Does have some pain with liftoff test as well as empty can test.  No bony tenderness of the left elbow, left arm.  No tenderness palpation noted right upper extremity.  Full range of motion with any difficulty.  Negative Neer's, Hawkins, empty can, liftoff test.  Skin:    General: Skin is warm and dry.     Comments: Good distal cap  refill.  LUE is not dusky in appearance or cool to touch.  Neurological:     Mental Status: She is alert.     Comments: Sensation intact along major nerve distributions of BUE  Psychiatric:        Speech: Speech normal.        Behavior: Behavior normal.     ED Results / Procedures / Treatments   Labs (all labs ordered are listed, but only abnormal results are displayed) Labs Reviewed - No data to display  EKG None  Radiology DG Shoulder Left  Result Date: 10/27/2019 CLINICAL DATA:  Shoulder pain following heavy lifting, initial encounter EXAM: LEFT SHOULDER - 2+ VIEW COMPARISON:  07/16/2015 FINDINGS: There is no evidence of fracture or dislocation. There is no evidence of arthropathy or other focal bone abnormality. Soft tissues are unremarkable. IMPRESSION: No acute abnormality noted. Electronically Signed   By: Alcide Clever M.D.   On: 10/27/2019 10:56    Procedures Procedures (including critical care time)  Medications Ordered in ED Medications - No data to display  ED Course  I have reviewed the triage vital signs and the nursing notes.  Pertinent labs & imaging results that were available during my care of the patient were reviewed by me and considered in my medical decision making (see chart for details).    MDM Rules/Calculators/A&P                          24 year old female presents for evaluation of left shoulder pain x2 months.  No previous trauma, injury, fall.  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  No fevers, warmth, numbness/weakness.  On initial arrival, she is afebrile nontoxic-appearing.  Vital signs are stable.  She is neurovascularly intact.  Consider muscular skeletal etiology versus rotator cuff impingement versus tendinitis.  History/physical exam not concerning for ischemic limb, septic arthritis.  Doubt acute bony abnormality but since pain has been persistent over the last 2 months, will plan for x-ray to ensure  that there is no bony lesion that could be contributing.  X-ray without any acute abnormality.  Discussed results with patient.  Encouraged at home supportive care measures.  Instructed patient to follow-up with sports medicine/orthopedics  as directed. At this time, patient exhibits no emergent life-threatening condition that require further evaluation in ED or admission. Patient had ample opportunity for questions and discussion. All patient's questions were answered with full understanding. Strict return precautions discussed. Patient expresses understanding and agreement to plan.   Portions of this note were generated with Scientist, clinical (histocompatibility and immunogenetics). Dictation errors may occur despite best attempts at proofreading.   Final Clinical Impression(s) / ED Diagnoses Final diagnoses:  Acute pain of left shoulder    Rx / DC Orders ED Discharge Orders    None       Rosana Hoes 10/27/19 1203    Arby Barrette, MD 11/14/19 1237

## 2019-11-14 ENCOUNTER — Ambulatory Visit: Payer: Medicaid Other | Admitting: Family Medicine

## 2019-11-14 ENCOUNTER — Telehealth: Payer: Self-pay | Admitting: Family Medicine

## 2019-11-14 NOTE — Telephone Encounter (Signed)
Called pt to offer ED f/u appt w/ Dr. Andee Lineman to leave VM --no voicemail set up.  --glh

## 2020-08-28 ENCOUNTER — Emergency Department (HOSPITAL_BASED_OUTPATIENT_CLINIC_OR_DEPARTMENT_OTHER)
Admission: EM | Admit: 2020-08-28 | Discharge: 2020-08-28 | Disposition: A | Discharge status: Home / Self Care | Payer: Medicaid Other | Attending: Emergency Medicine | Admitting: Emergency Medicine

## 2020-08-28 ENCOUNTER — Encounter (HOSPITAL_BASED_OUTPATIENT_CLINIC_OR_DEPARTMENT_OTHER): Payer: Self-pay

## 2020-08-28 ENCOUNTER — Emergency Department (HOSPITAL_BASED_OUTPATIENT_CLINIC_OR_DEPARTMENT_OTHER): Payer: Medicaid Other

## 2020-08-28 ENCOUNTER — Other Ambulatory Visit: Payer: Self-pay

## 2020-08-28 ENCOUNTER — Emergency Department (HOSPITAL_COMMUNITY): Payer: Medicaid Other

## 2020-08-28 DIAGNOSIS — O219 Vomiting of pregnancy, unspecified: Secondary | ICD-10-CM | POA: Insufficient documentation

## 2020-08-28 DIAGNOSIS — O4691 Antepartum hemorrhage, unspecified, first trimester: Secondary | ICD-10-CM | POA: Diagnosis not present

## 2020-08-28 DIAGNOSIS — Z3A01 Less than 8 weeks gestation of pregnancy: Secondary | ICD-10-CM | POA: Diagnosis not present

## 2020-08-28 DIAGNOSIS — R102 Pelvic and perineal pain: Secondary | ICD-10-CM | POA: Diagnosis not present

## 2020-08-28 DIAGNOSIS — R58 Hemorrhage, not elsewhere classified: Secondary | ICD-10-CM

## 2020-08-28 DIAGNOSIS — O4690 Antepartum hemorrhage, unspecified, unspecified trimester: Secondary | ICD-10-CM

## 2020-08-28 DIAGNOSIS — F17210 Nicotine dependence, cigarettes, uncomplicated: Secondary | ICD-10-CM | POA: Insufficient documentation

## 2020-08-28 LAB — URINALYSIS, MICROSCOPIC (REFLEX): WBC, UA: NONE SEEN WBC/hpf (ref 0–5)

## 2020-08-28 LAB — URINALYSIS, ROUTINE W REFLEX MICROSCOPIC
Bilirubin Urine: NEGATIVE
Glucose, UA: NEGATIVE mg/dL
Ketones, ur: NEGATIVE mg/dL
Leukocytes,Ua: NEGATIVE
Nitrite: NEGATIVE
Protein, ur: NEGATIVE mg/dL
Specific Gravity, Urine: 1.02 (ref 1.005–1.030)
pH: 7.5 (ref 5.0–8.0)

## 2020-08-28 LAB — PREGNANCY, URINE: Preg Test, Ur: POSITIVE — AB

## 2020-08-28 LAB — HCG, QUANTITATIVE, PREGNANCY: hCG, Beta Chain, Quant, S: 1308 m[IU]/mL — ABNORMAL HIGH (ref <5)

## 2020-08-28 NOTE — ED Notes (Signed)
ED Provider at bedside. 

## 2020-08-28 NOTE — Discharge Instructions (Addendum)
See your OB gyn for recheck in 2 days or follow up at [facility] for repeat Qhcg.  You need to have a repeat ultrasound in 2 weeks

## 2020-08-28 NOTE — ED Provider Notes (Signed)
[facility] EMERGENCY DEPARTMENT Provider Note   CSN: [id number] Arrival date & time: 08/28/20  1645     History Chief Complaint  Patient presents with   Vaginal Bleeding    Amy Kennedy is a 25 y.o. female.  The history is provided by the patient. No language interpreter was used.  Vaginal Bleeding Quality:  Lighter than menses Severity:  Moderate Onset quality:  Gradual Progression:  Worsening Chronicity:  New Possible pregnancy: yes   Relieved by:  Nothing Worsened by:  Nothing Ineffective treatments:  None tried Associated symptoms: nausea   Associated symptoms: no abdominal pain, no dysuria and no fever       Past Medical History:  Diagnosis Date   PID (acute pelvic inflammatory disease)     There are no problems to display for this patient.   History reviewed. No pertinent surgical history.   OB History     Gravida  2   Para      Term      Preterm      AB      Living         SAB      IAB      Ectopic      Multiple      Live Births              No family history on file.  Social History   Tobacco Use   Smoking status: Every Day    Packs/day: 0.50    Pack years: 0.00    Types: Cigars, Cigarettes   Smokeless tobacco: Never  Substance Use Topics   Alcohol use: No   Drug use: No    Home Medications Prior to Admission medications   Not on File    Allergies    Patient has no known allergies.  Review of Systems   Review of Systems  Constitutional:  Negative for fever.  Respiratory:  Negative for cough and shortness of breath.   Cardiovascular:  Negative for chest pain.  Gastrointestinal:  Positive for nausea. Negative for abdominal pain and vomiting.  Genitourinary:  Positive for vaginal bleeding. Negative for dysuria and hematuria.  Skin:  Negative for color change and rash.  All other systems reviewed and are negative.  Physical Exam Updated Vital Signs BP 126/67   Pulse 71   Temp 98.4 F (36.9 C)  (Oral)   Resp 16   Ht 5\' 6"  (1.676 m)   Wt 74.8 kg   SpO2 100%   BMI 26.63 kg/m   Physical Exam Vitals reviewed.  Cardiovascular:     Rate and Rhythm: Normal rate.  Pulmonary:     Effort: Pulmonary effort is normal.  Abdominal:     General: Abdomen is flat.  Musculoskeletal:        General: Normal range of motion.  Skin:    General: Skin is warm.  Neurological:     General: No focal deficit present.     Mental Status: She is alert.  Psychiatric:        Mood and Affect: Mood normal.    ED Results / Procedures / Treatments   Labs (all labs ordered are listed, but only abnormal results are displayed) Labs Reviewed  URINALYSIS, ROUTINE W REFLEX MICROSCOPIC - Abnormal; Notable for the following components:      Result Value   APPearance CLOUDY (*)    Hgb urine dipstick SMALL (*)    All other components within normal limits  PREGNANCY, URINE -  Abnormal; Notable for the following components:   Preg Test, Ur POSITIVE (*)    All other components within normal limits  URINALYSIS, MICROSCOPIC (REFLEX) - Abnormal; Notable for the following components:   Bacteria, UA RARE (*)    All other components within normal limits  HCG, QUANTITATIVE, PREGNANCY - Abnormal; Notable for the following components:   hCG, Beta Chain, Quant, S 1,308 (*)    All other components within normal limits  HCG, SERUM, QUALITATIVE    EKG None  Radiology US OB LESS THAN 14 WEEKS WITH OB TRANSVAGINAL  Result Date: 08/28/2020 CLINICAL DATA:  Vaginal bleeding EXAM: OBSTETRIC <14 WK [place] AND TRANSVAGINAL OB US TECHNIQUE: Both transabdominal and transvaginal ultrasound examinations were performed for complete evaluation of the gestation as well as the maternal uterus, adnexal regions, and pelvic cul-de-sac. Transvaginal technique was performed to assess early pregnancy. COMPARISON:  None. FINDINGS: Intrauterine gestational sac: Probable small intrauterine gestational sac Yolk sac:  Not visualized Embryo:  Not  visualized MSD: 5.4 mm   5 w   2 d Subchorionic hemorrhage:  None visualized. Maternal uterus/adnexae: Right ovary is within normal limits and measures 4.2 x 1.7 x 3.2 cm. The left ovary measures 4.2 by 2.1 x 1 cm. Exophytic or paraovarian cyst measuring 1.6 cm. No significant free fluid IMPRESSION: Probable early intrauterine gestational sac, but no yolk sac, fetal pole, or cardiac activity yet visualized. Recommend follow-up quantitative B-HCG levels and follow-up US in 14 days to confirm and assess viability. This recommendation follows SRU consensus guidelines: Diagnostic Criteria for Nonviable Pregnancy Early in the First Trimester. Malva Limes Med 2013; 144:4584-83. Electronically Signed   By: Jasmine Pang M.D.   On: 08/28/2020 19:48    Procedures Procedures   Medications Ordered in ED Medications - No data to display  ED Course  I have reviewed the triage vital signs and the nursing notes.  Pertinent labs & imaging results that were available during my care of the patient were reviewed by me and considered in my medical decision making (see chart for details).    MDM Rules/Calculators/A&P                         MDM:  Pt advised to recheck with her ob gyn or followup at [facility] in 2 days for recheck.  Pt advised of need for follow up ultrasound  Final Clinical Impression(s) / ED Diagnoses Final diagnoses:  Vaginal bleeding in pregnancy    Rx / DC Orders ED Discharge Orders     None     An After Visit Summary was printed and given to the patient.    Elson Areas, PA-C 08/29/20 0009    Maia Plan, MD 09/01/20 [phone]

## 2020-08-28 NOTE — ED Triage Notes (Signed)
Pt c/o light vaginal bleeding, cramping-concerned she may be pregnant-NAD-steady gait

## 2021-10-11 IMAGING — CR DG SHOULDER 2+V*L*
3 series · 3 of 3 positions shown · non-contrast
Comparison: 07/16/2015

CLINICAL DATA: Shoulder pain following heavy lifting, initial
encounter

EXAM:
LEFT SHOULDER - 2+ VIEW

[w shoulder grashey left]
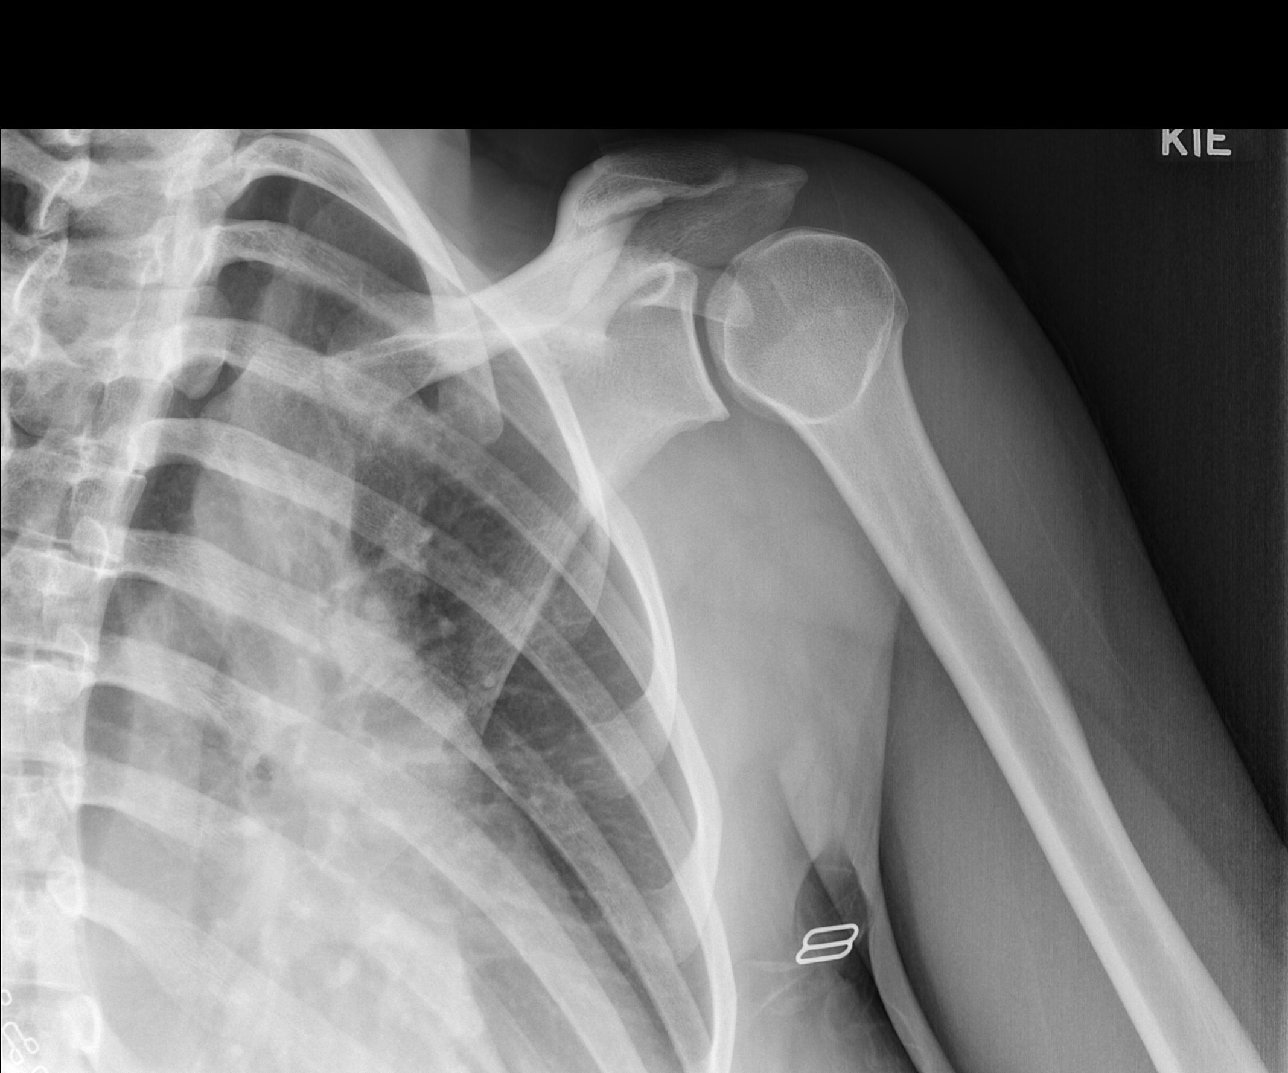

[w shoulder y view left]
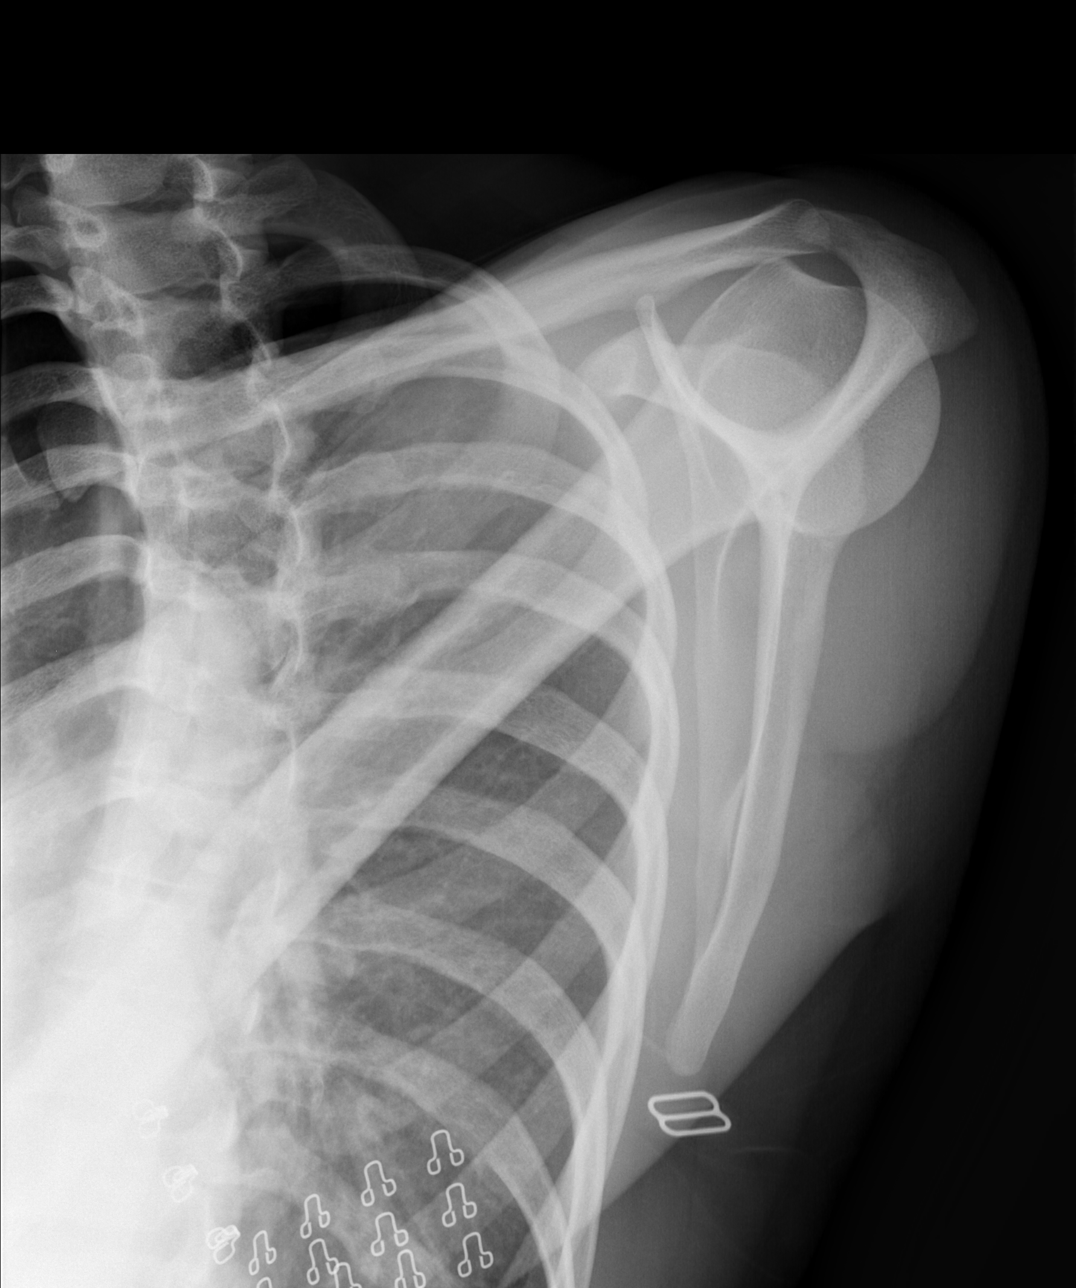

[x shoulder axillary left]
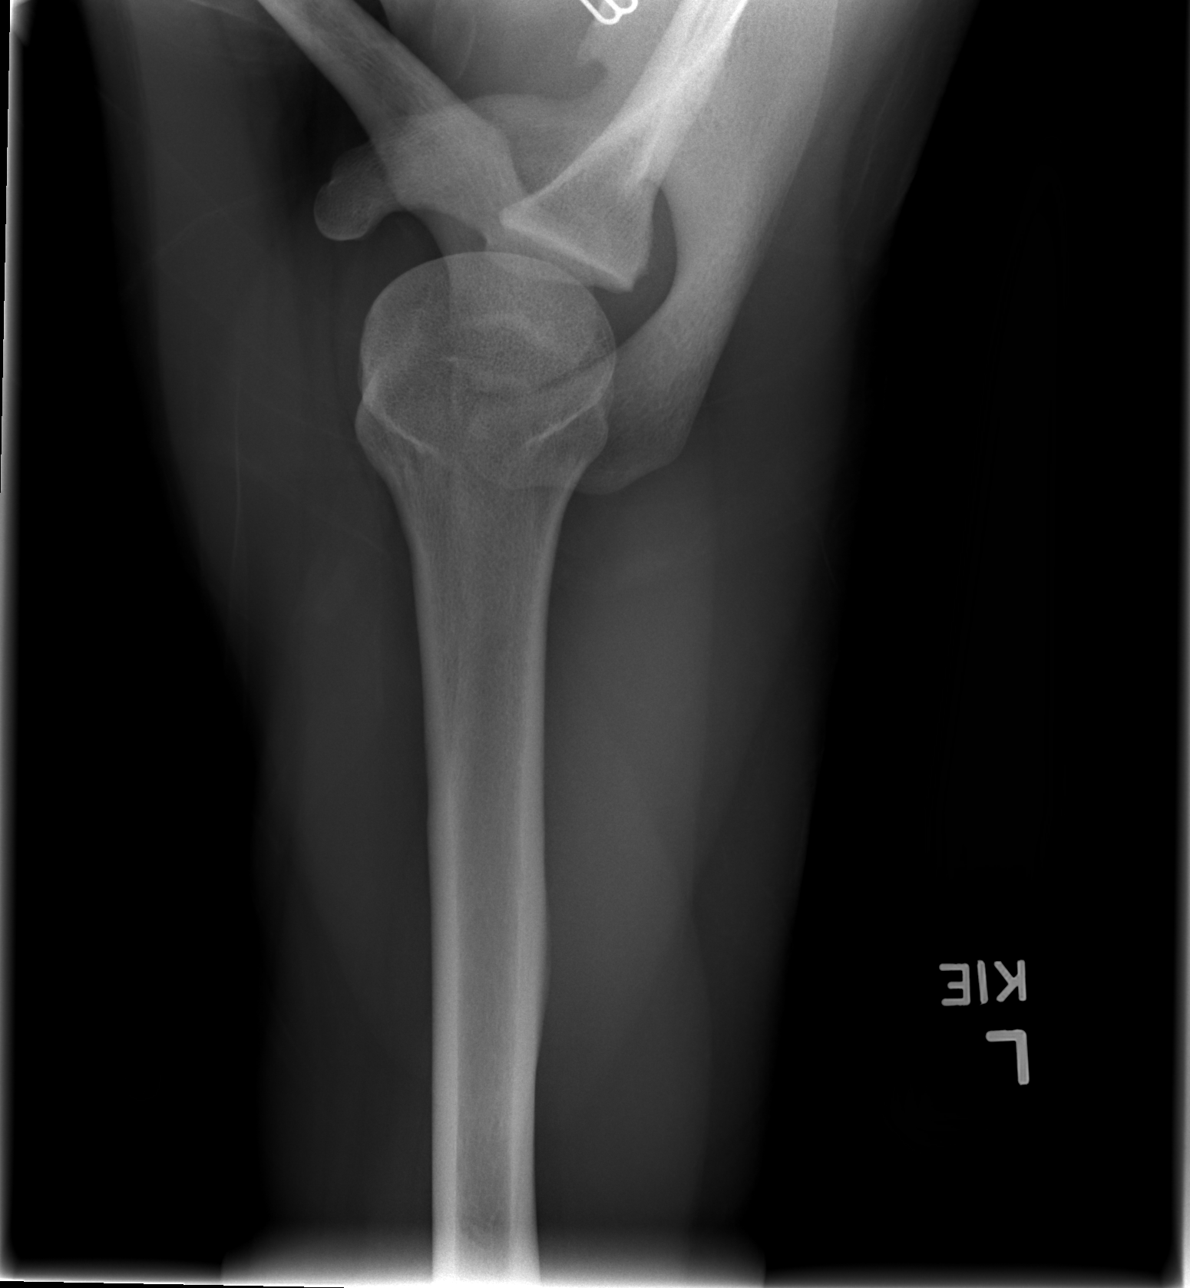

[3 of 3 positions shown; findings below may reference images not displayed]

FINDINGS: There is no evidence of fracture or dislocation. There is no
evidence of arthropathy or other focal bone abnormality. Soft
tissues are unremarkable.
IMPRESSION: No acute abnormality noted.

## 2022-05-30 ENCOUNTER — Encounter (HOSPITAL_BASED_OUTPATIENT_CLINIC_OR_DEPARTMENT_OTHER): Payer: Self-pay

## 2022-05-30 ENCOUNTER — Other Ambulatory Visit: Payer: Self-pay

## 2022-05-30 ENCOUNTER — Emergency Department (HOSPITAL_BASED_OUTPATIENT_CLINIC_OR_DEPARTMENT_OTHER)
Admission: EM | Admit: 2022-05-30 | Discharge: 2022-05-31 | Disposition: A | Discharge status: Home / Self Care | Payer: Medicaid Other | Attending: Emergency Medicine | Admitting: Emergency Medicine

## 2022-05-30 ENCOUNTER — Emergency Department (HOSPITAL_BASED_OUTPATIENT_CLINIC_OR_DEPARTMENT_OTHER): Payer: Medicaid Other

## 2022-05-30 DIAGNOSIS — B3731 Acute candidiasis of vulva and vagina: Secondary | ICD-10-CM | POA: Insufficient documentation

## 2022-05-30 DIAGNOSIS — N939 Abnormal uterine and vaginal bleeding, unspecified: Secondary | ICD-10-CM | POA: Insufficient documentation

## 2022-05-30 DIAGNOSIS — R109 Unspecified abdominal pain: Secondary | ICD-10-CM | POA: Insufficient documentation

## 2022-05-30 DIAGNOSIS — N760 Acute vaginitis: Secondary | ICD-10-CM | POA: Diagnosis not present

## 2022-05-30 DIAGNOSIS — B9689 Other specified bacterial agents as the cause of diseases classified elsewhere: Secondary | ICD-10-CM

## 2022-05-30 DIAGNOSIS — B379 Candidiasis, unspecified: Secondary | ICD-10-CM

## 2022-05-30 LAB — URINALYSIS, ROUTINE W REFLEX MICROSCOPIC
Bilirubin Urine: NEGATIVE
Glucose, UA: NEGATIVE mg/dL
Ketones, ur: NEGATIVE mg/dL
Leukocytes,Ua: NEGATIVE
Nitrite: NEGATIVE
Protein, ur: NEGATIVE mg/dL
Specific Gravity, Urine: 1.02 (ref 1.005–1.030)
pH: 7 (ref 5.0–8.0)

## 2022-05-30 LAB — URINALYSIS, MICROSCOPIC (REFLEX)

## 2022-05-30 LAB — CBC
HCT: 39.8 % (ref 36.0–46.0)
Hemoglobin: 13.1 g/dL (ref 12.0–15.0)
MCH: 28.4 pg (ref 26.0–34.0)
MCHC: 32.9 g/dL (ref 30.0–36.0)
MCV: 86.3 fL (ref 80.0–100.0)
Platelets: 274 10*3/uL (ref 150–400)
RBC: 4.61 MIL/uL (ref 3.87–5.11)
RDW: 13.6 % (ref 11.5–15.5)
WBC: 5.3 10*3/uL (ref 4.0–10.5)
nRBC: 0 % (ref 0.0–0.2)

## 2022-05-30 LAB — WET PREP, GENITAL
Sperm: NONE SEEN
Trich, Wet Prep: NONE SEEN
WBC, Wet Prep HPF POC: >=10 — AB (ref <10)

## 2022-05-30 LAB — BASIC METABOLIC PANEL
Anion gap: 5 (ref 5–15)
BUN: 12 mg/dL (ref 6–20)
CO2: 24 mmol/L (ref 22–32)
Calcium: 8.8 mg/dL — ABNORMAL LOW (ref 8.9–10.3)
Chloride: 105 mmol/L (ref 98–111)
Creatinine, Ser: 1.03 mg/dL — ABNORMAL HIGH (ref 0.44–1.00)
GFR, Estimated: >60 mL/min (ref >60)
Glucose, Bld: 113 mg/dL — ABNORMAL HIGH (ref 70–99)
Potassium: 3.4 mmol/L — ABNORMAL LOW (ref 3.5–5.1)
Sodium: 134 mmol/L — ABNORMAL LOW (ref 135–145)

## 2022-05-30 LAB — PREGNANCY, URINE: Preg Test, Ur: NEGATIVE

## 2022-05-30 MED ORDER — IOHEXOL 300 MG/ML  SOLN
100.0000 mL | Freq: Once | INTRAMUSCULAR | Status: AC | PRN
  Administered 2022-05-30: 100 mL via INTRAVENOUS

## 2022-05-30 NOTE — ED Provider Notes (Signed)
[place] [facility]MENT [facility] Provider Note   CSN: [id number] Arrival date & time: 05/30/22  1929     History  Chief Complaint  Patient presents with   Vaginal Bleeding    Amy Kennedy is a 27 y.o. female.  The history is provided by the patient and medical records. No language interpreter was used.  Vaginal Bleeding Quality:  Dark red Severity:  Moderate Onset quality:  Gradual Duration:  3 months Timing:  Constant Progression:  Waxing and waning Chronicity:  New Menstrual history:  Irregular Relieved by:  Nothing Worsened by:  Nothing Ineffective treatments:  None tried Associated symptoms: abdominal pain and vaginal discharge   Associated symptoms: no back pain, no dizziness, no dysuria, no fatigue, no fever and no nausea        Home Medications Prior to Admission medications   Not on File      Allergies    Patient has no known allergies.    Review of Systems   Review of Systems  Constitutional:  Negative for chills, fatigue and fever.  HENT:  Negative for congestion.   Respiratory:  Negative for choking, shortness of breath and wheezing.   Gastrointestinal:  Positive for abdominal pain. Negative for constipation, diarrhea, nausea and vomiting.  Genitourinary:  Positive for vaginal bleeding and vaginal discharge. Negative for dysuria, flank pain, frequency and pelvic pain.  Musculoskeletal:  Negative for back pain, neck pain and neck stiffness.  Skin:  Negative for rash and wound.  Neurological:  Negative for dizziness, light-headedness and headaches.  Psychiatric/Behavioral:  Negative for agitation.   All other systems reviewed and are negative.   Physical Exam Updated Vital Signs BP (!) 144/88   Pulse 70   Temp 98.6 F (37 C) (Oral)   Resp 14   SpO2 99%  Physical Exam Vitals and nursing note reviewed. Exam conducted with a chaperone present.  Constitutional:      General: She is not in acute distress.     Appearance: She is well-developed. She is not ill-appearing, toxic-appearing or diaphoretic.  HENT:     Head: Normocephalic and atraumatic.     Nose: Nose normal.     Mouth/Throat:     Mouth: Mucous membranes are moist.  Eyes:     Extraocular Movements: Extraocular movements intact.     Conjunctiva/sclera: Conjunctivae normal.     Pupils: Pupils are equal, round, and reactive to light.  Cardiovascular:     Rate and Rhythm: Normal rate and regular rhythm.     Heart sounds: No murmur heard. Pulmonary:     Effort: Pulmonary effort is normal. No respiratory distress.     Breath sounds: Normal breath sounds. No wheezing, rhonchi or rales.  Chest:     Chest wall: No tenderness.  Abdominal:     General: Abdomen is flat.     Palpations: Abdomen is soft.     Tenderness: There is abdominal tenderness. There is no right CVA tenderness, left CVA tenderness, guarding or rebound.  Genitourinary:    General: Normal vulva.     Labia:        Right: No tenderness.        Left: No tenderness.      Vagina: Vaginal discharge present. No tenderness.     Cervix: No cervical motion tenderness, erythema or cervical bleeding.     Uterus: Not tender.      Adnexa:        Right: No tenderness.  Left: No tenderness.    Musculoskeletal:        General: No swelling or signs of injury.     Cervical back: Neck supple.  Skin:    General: Skin is warm and dry.     Capillary Refill: Capillary refill takes less than 2 seconds.     Findings: No erythema or rash.  Neurological:     General: No focal deficit present.     Mental Status: She is alert.     Motor: No weakness.  Psychiatric:        Mood and Affect: Mood normal.     ED Results / Procedures / Treatments   Labs (all labs ordered are listed, but only abnormal results are displayed) Labs Reviewed  WET PREP, GENITAL - Abnormal; Notable for the following components:      Result Value   Yeast Wet Prep HPF POC PRESENT (*)    Clue Cells Wet  Prep HPF POC PRESENT (*)    WBC, Wet Prep HPF POC >=10 (*)    All other components within normal limits  URINALYSIS, ROUTINE W REFLEX MICROSCOPIC - Abnormal; Notable for the following components:   APPearance HAZY (*)    Hgb urine dipstick SMALL (*)    All other components within normal limits  BASIC METABOLIC PANEL - Abnormal; Notable for the following components:   Sodium 134 (*)    Potassium 3.4 (*)    Glucose, Bld 113 (*)    Creatinine, Ser 1.03 (*)    Calcium 8.8 (*)    All other components within normal limits  URINALYSIS, MICROSCOPIC (REFLEX) - Abnormal; Notable for the following components:   Bacteria, UA MANY (*)    All other components within normal limits  PREGNANCY, URINE  CBC  GC/CHLAMYDIA PROBE AMP ([place]) NOT AT [facility]    EKG None  Radiology CT ABDOMEN PELVIS W CONTRAST  Result Date: 05/30/2022 CLINICAL DATA:  Pelvic pain EXAM: CT ABDOMEN AND PELVIS WITH CONTRAST TECHNIQUE: Multidetector CT imaging of the abdomen and pelvis was performed using the standard protocol following bolus administration of intravenous contrast. RADIATION DOSE REDUCTION: This exam was performed according to the departmental dose-optimization program which includes automated exposure control, adjustment of the mA and/or kV according to patient size and/or use of iterative reconstruction technique. CONTRAST:  136mL OMNIPAQUE IOHEXOL 300 MG/ML  SOLN COMPARISON:  None Available. FINDINGS: Lower chest: No acute abnormality. Hepatobiliary: No focal liver abnormality is seen. No gallstones, gallbladder wall thickening, or biliary dilatation. Pancreas: Unremarkable. No pancreatic ductal dilatation or surrounding inflammatory changes. Spleen: Normal in size without focal abnormality. Adrenals/Urinary Tract: Adrenal glands are normal. Kidneys show no hydronephrosis. 4 mm nonobstructing stone lower pole right kidney. Punctate nonobstructing stone lower pole left kidney. The bladder is unremarkable.  Stomach/Bowel: Stomach is within normal limits. Appendix appears normal. No evidence of bowel wall thickening, distention, or inflammatory changes. Vascular/Lymphatic: No significant vascular findings are present. No enlarged abdominal or pelvic lymph nodes. Reproductive: Uterus and bilateral adnexa are unremarkable. Other: No abdominal wall hernia or abnormality. No abdominopelvic ascites. Musculoskeletal: No acute or significant osseous findings. IMPRESSION: 1. No acute abnormality identified in the abdomen and pelvis. 2. Nonobstructing stones in the kidneys Electronically Signed   By: Donavan Foil M.D.   On: 05/30/2022 23:57    Procedures Procedures    Medications Ordered in ED Medications  iohexol (OMNIPAQUE) 300 MG/ML solution 100 mL (100 mLs Intravenous Contrast Given 05/30/22 2348)    ED Course/ Medical Decision  Making/ A&P                             Medical Decision Making Amount and/or Complexity of Data Reviewed Labs: ordered.    Amy Kennedy is a 27 y.o. female with a past medical history significant for previous PID who presents with intermittent vaginal bleeding, abdominal discomfort, and some vaginal discharge.  According to patient, she has had some vaginal bleeding on and off for the last few months and says she recently started Depo shots.  She said that her pain does not feel like when she had PID and she is only had some minimal discharge.  She reports she was treated for trichomonas a month ago.  Felt that this was cleared.  She denies any nausea, vomiting, fevers, or chills.  Denies any constipation or diarrhea.  Denies any rashes.  On exam, lungs clear.  Chest nontender.  Abdomen was slightly tender in the left lower quadrant and left abdomen.  Bowel sounds were appreciated.  Pelvic exam was performed with a chaperone and she did have some discharge but otherwise did not have significant cervical motion tenderness or adnexal tenderness.  Back and flanks nontender.   Patient otherwise well-appearing.  Unfortunately, we do not have ultrasound available at this time to be able to do an ultrasound and rule out TOA however given her lack of severe tenderness I have low suspicion for PID or TOA.  We will get the wet prep and gonorrhea/committee test sent off.  Wet prep showed BV and yeast so she will be treated for this.  She reports that Flagyl has not seem to help in the past so we will do a different antibiotic.  Pregnancy test negative and labs otherwise did not show critical abnormality at this time.  We did however agree to get a CT scan to rule out diverticulitis or other cause of the abdominal pain.  CT scan reassuring and no evidence of PID or TOA on the CT imaging.  We discussed that some things can be subtle and not seen on CT but would be evident on ultrasound.  Patient will follow-up with her OB/GYN and will call them tomorrow.  She agreed on holding on empiric STD antibiotics at this time but if something is positive she will likely be called.  And she will follow-up with her OB/GYN.  Patient is feeling better and has no other questions or concerns.  Patient discharged in good condition.         Final Clinical Impression(s) / ED Diagnoses Final diagnoses:  Vaginal bleeding  BV (bacterial vaginosis)  Yeast infection  Abdominal pain, unspecified abdominal location    Rx / DC Orders ED Discharge Orders          Ordered    clindamycin (CLEOCIN) 300 MG capsule  2 times daily        05/31/22 0012    fluconazole (DIFLUCAN) 150 MG tablet   Once        05/31/22 0012            Clinical Impression: 1. Vaginal bleeding   2. BV (bacterial vaginosis)   3. Yeast infection   4. Abdominal pain, unspecified abdominal location     Disposition: Discharge  Condition: Good  I have discussed the results, Dx and Tx plan with the pt(& family if present). He/she/they expressed understanding and agree(s) with the plan. Discharge instructions  discussed at great length. Strict  return precautions discussed and pt &/or family have verbalized understanding of the instructions. No further questions at time of discharge.    Discharge Medication List as of 05/31/2022 12:13 AM     START taking these medications   Details  clindamycin (CLEOCIN) 300 MG capsule Take 1 capsule (300 mg total) by mouth in the morning and at bedtime for 7 days., Starting Mon 05/31/2022, Until Mon 06/07/2022, Print    fluconazole (DIFLUCAN) 150 MG tablet Take 1 tablet (150 mg total) by mouth once for 1 dose., Starting Mon 05/31/2022, Print        Follow Up: your obgyn         Sandra Brents, Gwenyth Allegra, MD 05/31/22 [phone]

## 2022-05-30 NOTE — ED Triage Notes (Signed)
Pt here for vaginal bleeding intermittently since end of January. Pt reports blood is brown and sometimes has clots, bleeding started again after having intercourse. Pt reports intermittent pelvic pain.

## 2022-05-30 NOTE — ED Provider Notes (Incomplete)
  [place] [facility] Provider Note   CSN: [id number] Arrival date & time: 05/30/22  1929     History {Add pertinent medical, surgical, social history, OB history to HPI:1} Chief Complaint  Patient presents with  . Vaginal Bleeding    Amy Kennedy is a 27 y.o. female.   Vaginal Bleeding      Home Medications Prior to Admission medications   Not on File      Allergies    Patient has no known allergies.    Review of Systems   Review of Systems  Genitourinary:  Positive for vaginal bleeding.    Physical Exam Updated Vital Signs BP (!) 144/88   Pulse 70   Temp 98.6 F (37 C) (Oral)   Resp 14   SpO2 99%  Physical Exam  ED Results / Procedures / Treatments   Labs (all labs ordered are listed, but only abnormal results are displayed) Labs Reviewed  URINALYSIS, ROUTINE W REFLEX MICROSCOPIC - Abnormal; Notable for the following components:      Result Value   APPearance HAZY (*)    Hgb urine dipstick SMALL (*)    All other components within normal limits  BASIC METABOLIC PANEL - Abnormal; Notable for the following components:   Sodium 134 (*)    Potassium 3.4 (*)    Glucose, Bld 113 (*)    Creatinine, Ser 1.03 (*)    Calcium 8.8 (*)    All other components within normal limits  URINALYSIS, MICROSCOPIC (REFLEX) - Abnormal; Notable for the following components:   Bacteria, UA MANY (*)    All other components within normal limits  PREGNANCY, URINE  CBC    EKG None  Radiology No results found.  Procedures Procedures  {Document cardiac monitor, telemetry assessment procedure when appropriate:1}  Medications Ordered in ED Medications - No data to display  ED Course/ Medical Decision Making/ A&P   {   Click here for ABCD2, HEART and other calculatorsREFRESH Note before signing :1}                          Medical Decision Making Amount and/or Complexity of Data Reviewed Labs: ordered.   ***  {Document  critical care time when appropriate:1} {Document review of labs and clinical decision tools ie heart score, Chads2Vasc2 etc:1}  {Document your independent review of radiology images, and any outside records:1} {Document your discussion with family members, caretakers, and with consultants:1} {Document social determinants of health affecting pt's care:1} {Document your decision making why or why not admission, treatments were needed:1} Final Clinical Impression(s) / ED Diagnoses Final diagnoses:  None    Rx / DC Orders ED Discharge Orders     None

## 2022-05-30 NOTE — ED Notes (Signed)
Patient transported to CT 

## 2022-05-31 MED ORDER — CLINDAMYCIN HCL 300 MG PO CAPS: 300.0000 mg | ORAL_CAPSULE | Freq: Two times a day (BID) | ORAL | 0 refills | Status: AC

## 2022-05-31 MED ORDER — FLUCONAZOLE 150 MG PO TABS: 150.0000 mg | ORAL_TABLET | Freq: Once | ORAL | 0 refills | Status: AC

## 2022-05-31 NOTE — ED Notes (Signed)
Discharge paperwork reviewed entirely with patient, including Rx's and follow up care. Pain was under control. Pt verbalized understanding as well as all parties involved. No questions or concerns voiced at the time of discharge. No acute distress noted.   Pt ambulated out to PVA without incident or assistance.  

## 2022-05-31 NOTE — Discharge Instructions (Signed)
Your history, exam, and evaluation today revealed evidence of BV, yeast infection, and the CT scan was otherwise reassuring.  You did not have significant vaginal bleeding on our exam nor did you have significant tenderness on exam.  We agreed to hold on ED to ED transfer for ultrasound to rule out PID given the lack of tenderness and reassuring CT.  As we discussed, there is still a chance that there is infection so please follow-up on the STD testing that we did and call your OB/GYN tomorrow to discuss outpatient follow-up.  If any symptoms were to change or worsen acutely, please return to the nearest emergency department.  Please take the antibiotics to treat the infections we did find.

## 2022-06-01 LAB — GC/CHLAMYDIA PROBE AMP (~~LOC~~) NOT AT ARMC
Chlamydia: NEGATIVE
Comment: NEGATIVE
Comment: NORMAL
Neisseria Gonorrhea: NEGATIVE

## 2022-06-28 ENCOUNTER — Encounter: Payer: Self-pay | Admitting: *Deleted

## 2022-08-13 IMAGING — US US OB < 14 WEEKS - US OB TV
1 series · 13 of 28 positions shown · non-contrast
Comparison: None.

CLINICAL DATA: Vaginal bleeding

EXAM:
OBSTETRIC <14 WK US AND TRANSVAGINAL OB US
TECHNIQUE: Both transabdominal and transvaginal ultrasound examinations were
performed for complete evaluation of the gestation as well as the
maternal uterus, adnexal regions, and pelvic cul-de-sac.
Transvaginal technique was performed to assess early pregnancy.

[Series 1: us ob < 14 weeks - us ob tv · 13 of 70 slices shown]
[im 3/70]
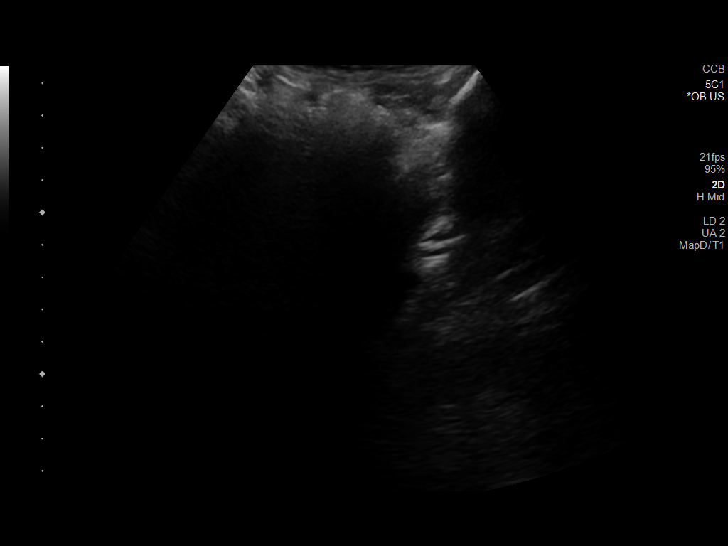
[im 8/70]
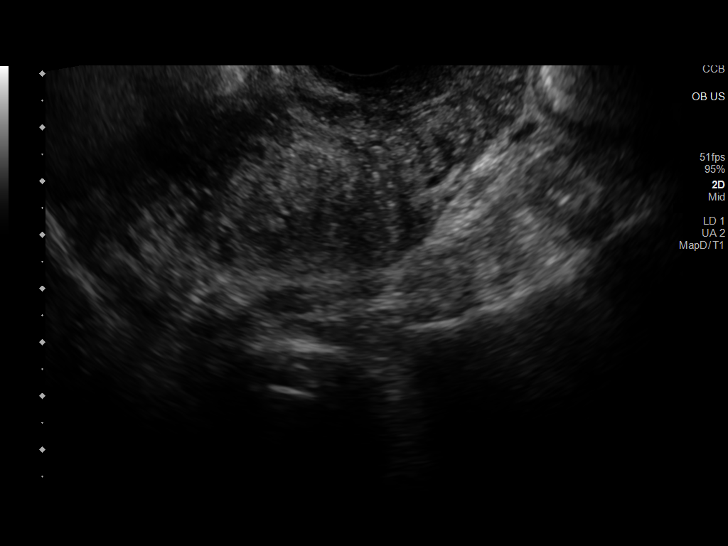
[im 13/70]
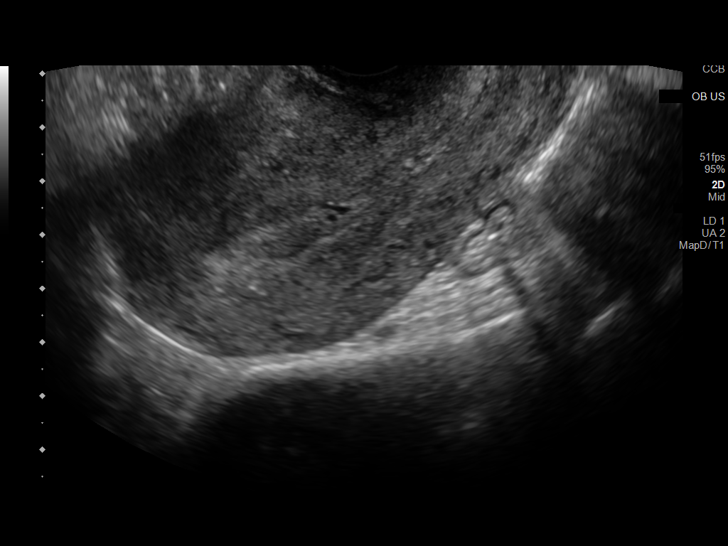
[im 18/70]
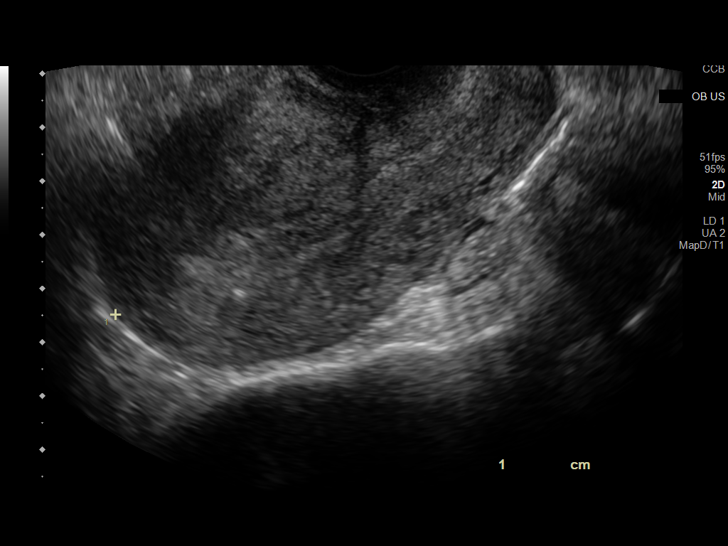
[im 24/70]
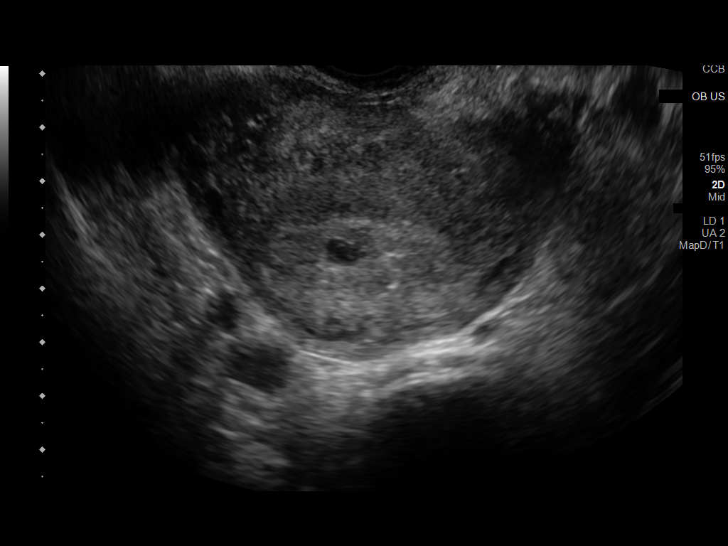
[im 29/70]
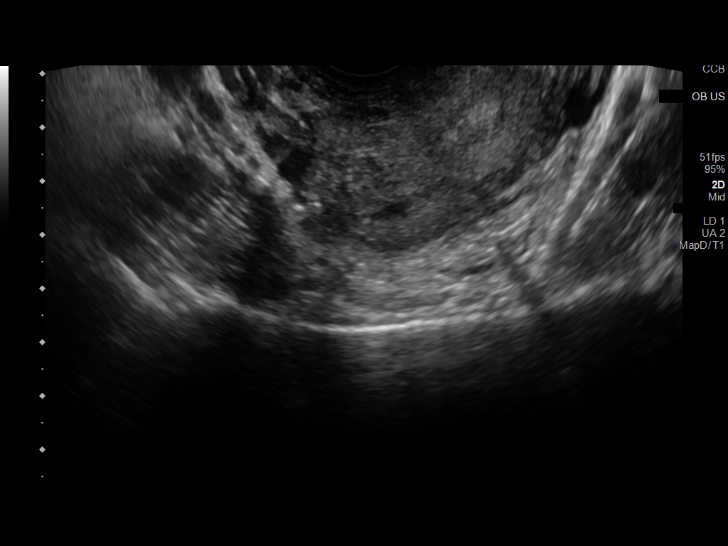
[im 36/70]
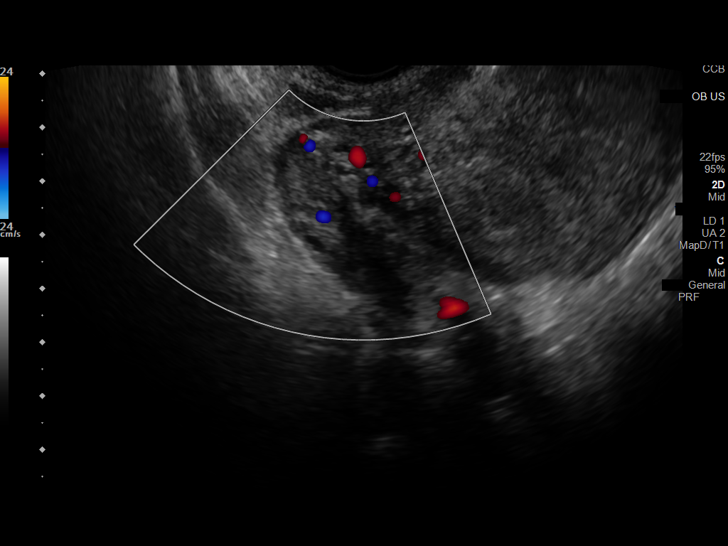
[im 41/70]
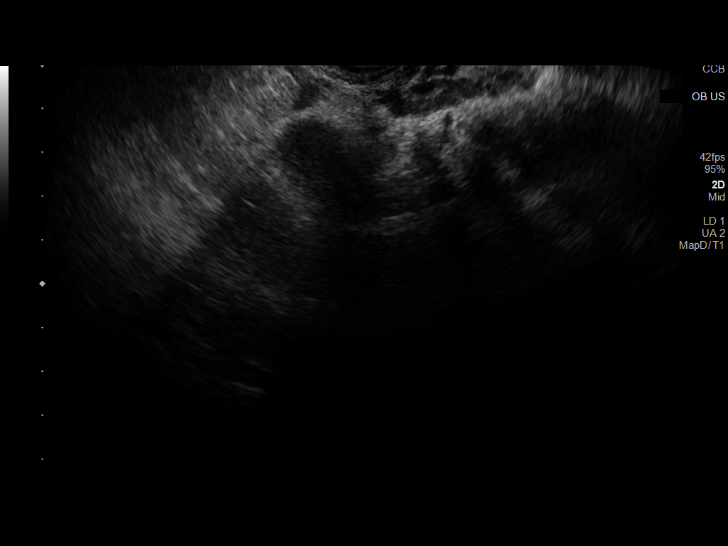
[im 47/70]
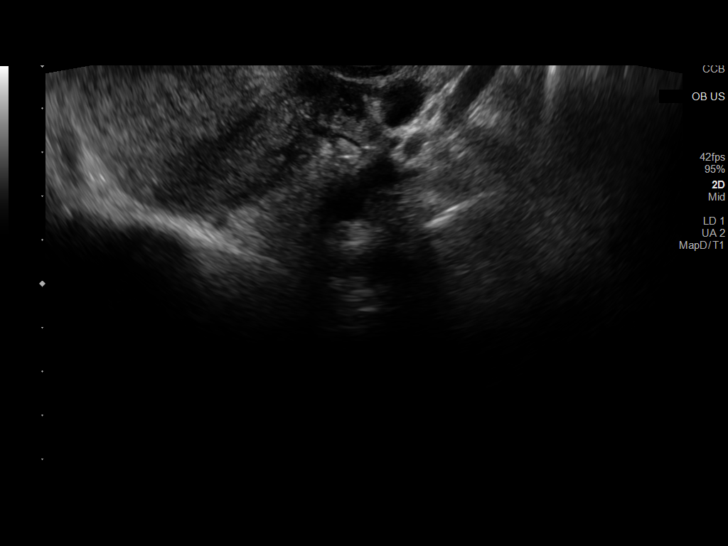
[im 52/70]
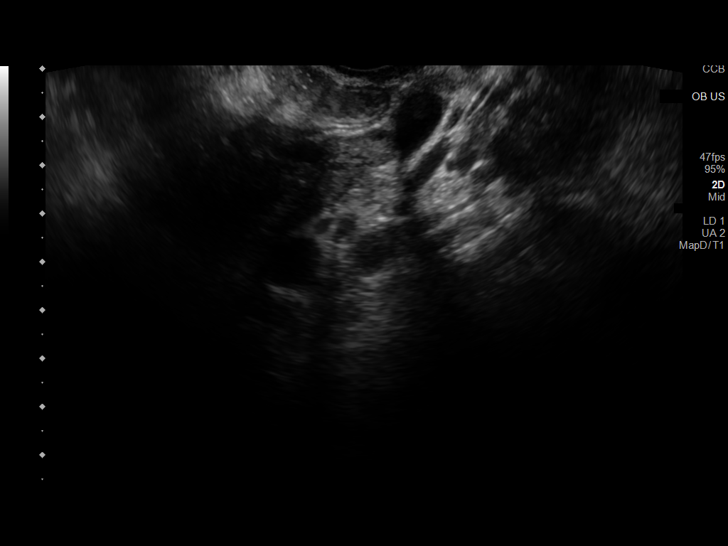
[im 57/70]
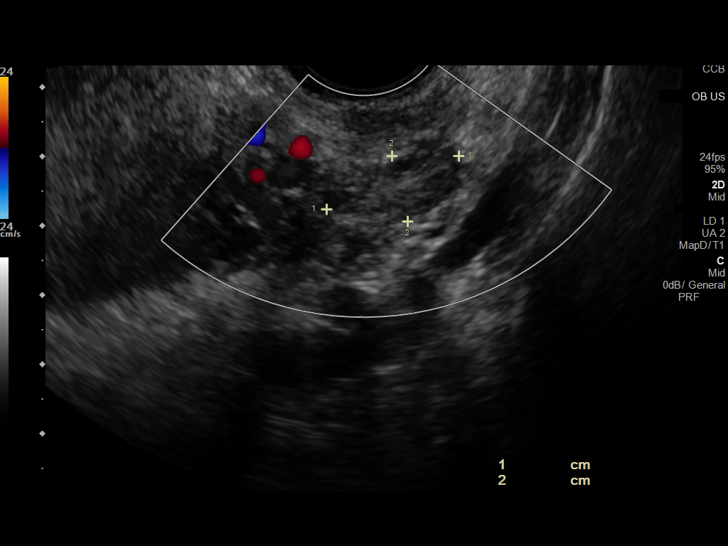
[im 62/70]
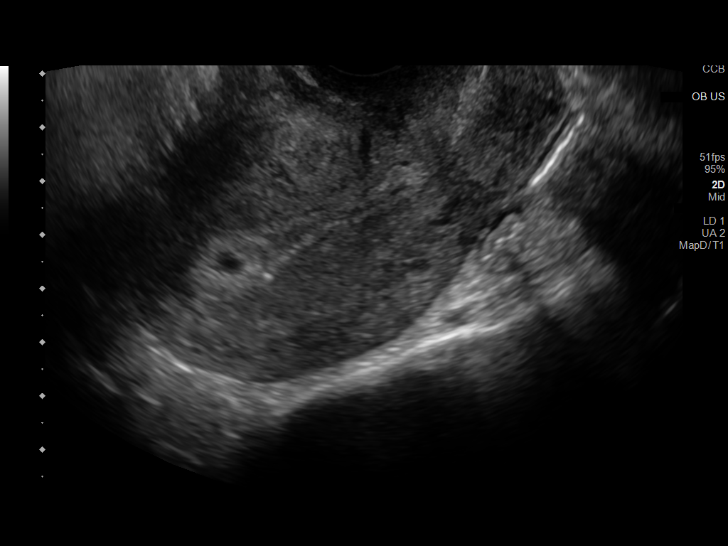
[im 67/70]
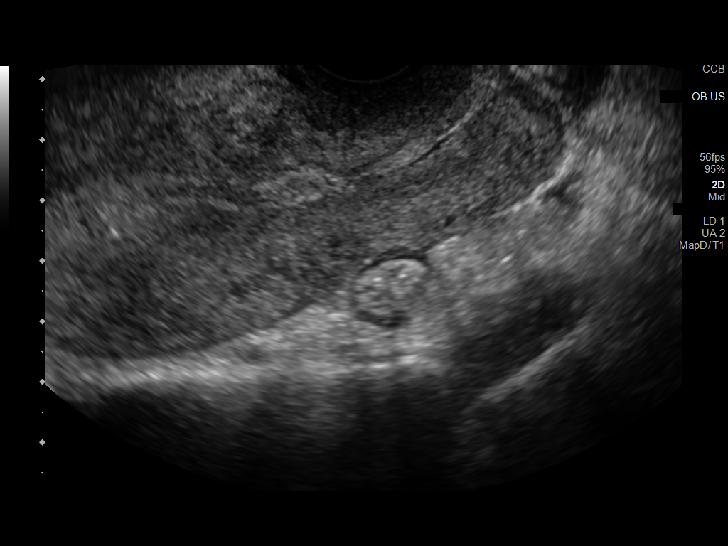

[13 of 28 positions shown; findings below may reference images not displayed]

FINDINGS: Intrauterine gestational sac: Probable small intrauterine
gestational sac

Yolk sac:  Not visualized

Embryo:  Not visualized

MSD: 5.4 mm   5 w   2 d

Subchorionic hemorrhage:  None visualized.

Maternal uterus/adnexae: Right ovary is within normal limits and
measures 4.2 x 1.7 x 3.2 cm. The left ovary measures 4.2 by 2.1 x 1
cm. Exophytic or paraovarian cyst measuring 1.6 cm. No significant
free fluid
IMPRESSION: Probable early intrauterine gestational sac, but no yolk sac, fetal
pole, or cardiac activity yet visualized. Recommend follow-up
quantitative B-HCG levels and follow-up US in 14 days to confirm and
assess viability. This recommendation follows SRU consensus
guidelines: Diagnostic Criteria for Nonviable Pregnancy Early in the
First Trimester. N Engl J Med 6245; [DATE].
